# Patient Record
Sex: Female | Born: 1960 | Race: White | Hispanic: No | Marital: Married | Smoking: Former smoker
Health system: Southern US, Community
[De-identification: ages and names within clinical notes are randomized; demographics above are authoritative.]

## PROBLEM LIST (undated history)

## (undated) DIAGNOSIS — R7303 Prediabetes: Secondary | ICD-10-CM

## (undated) DIAGNOSIS — F32A Depression, unspecified: Secondary | ICD-10-CM

## (undated) DIAGNOSIS — F329 Major depressive disorder, single episode, unspecified: Secondary | ICD-10-CM

## (undated) DIAGNOSIS — Z87442 Personal history of urinary calculi: Secondary | ICD-10-CM

## (undated) DIAGNOSIS — F419 Anxiety disorder, unspecified: Secondary | ICD-10-CM

## (undated) DIAGNOSIS — Z889 Allergy status to unspecified drugs, medicaments and biological substances status: Secondary | ICD-10-CM

## (undated) DIAGNOSIS — K5900 Constipation, unspecified: Secondary | ICD-10-CM

## (undated) DIAGNOSIS — T7840XA Allergy, unspecified, initial encounter: Secondary | ICD-10-CM

## (undated) DIAGNOSIS — M1990 Unspecified osteoarthritis, unspecified site: Secondary | ICD-10-CM

## (undated) DIAGNOSIS — J449 Chronic obstructive pulmonary disease, unspecified: Secondary | ICD-10-CM

## (undated) DIAGNOSIS — E785 Hyperlipidemia, unspecified: Secondary | ICD-10-CM

## (undated) HISTORY — DX: Hyperlipidemia, unspecified: E78.5

## (undated) HISTORY — PX: VESICOVAGINAL FISTULA CLOSURE W/ TAH: SUR271

## (undated) HISTORY — DX: Major depressive disorder, single episode, unspecified: F32.9

## (undated) HISTORY — DX: Depression, unspecified: F32.A

## (undated) HISTORY — DX: Constipation, unspecified: K59.00

## (undated) HISTORY — DX: Allergy status to unspecified drugs, medicaments and biological substances: Z88.9

## (undated) HISTORY — PX: COLONOSCOPY: SHX174

## (undated) HISTORY — DX: Allergy, unspecified, initial encounter: T78.40XA

## (undated) HISTORY — DX: Anxiety disorder, unspecified: F41.9

## (undated) HISTORY — DX: Prediabetes: R73.03

## (undated) HISTORY — PX: KIDNEY SURGERY: SHX687

## (undated) HISTORY — PX: PARTIAL HYSTERECTOMY: SHX80

## (undated) HISTORY — DX: Chronic obstructive pulmonary disease, unspecified: J44.9

---

## 1999-06-05 ENCOUNTER — Encounter: Payer: Self-pay | Admitting: Urology

## 1999-06-05 ENCOUNTER — Emergency Department (HOSPITAL_COMMUNITY): Admission: EM | Admit: 1999-06-05 | Discharge: 1999-06-05 | Payer: Self-pay | Admitting: Emergency Medicine

## 1999-06-06 ENCOUNTER — Encounter: Payer: Self-pay | Admitting: Urology

## 1999-06-06 ENCOUNTER — Inpatient Hospital Stay (HOSPITAL_COMMUNITY): Admission: EM | Admit: 1999-06-06 | Discharge: 1999-06-08 | Payer: Self-pay | Admitting: Emergency Medicine

## 1999-06-07 ENCOUNTER — Encounter: Payer: Self-pay | Admitting: Urology

## 1999-06-24 ENCOUNTER — Encounter: Payer: Self-pay | Admitting: Urology

## 1999-06-24 ENCOUNTER — Ambulatory Visit (HOSPITAL_COMMUNITY): Admission: RE | Admit: 1999-06-24 | Discharge: 1999-06-24 | Payer: Self-pay | Admitting: Urology

## 2001-08-21 ENCOUNTER — Encounter: Payer: Self-pay | Admitting: General Practice

## 2001-08-21 ENCOUNTER — Ambulatory Visit (HOSPITAL_COMMUNITY): Admission: RE | Admit: 2001-08-21 | Discharge: 2001-08-21 | Payer: Self-pay | Admitting: General Practice

## 2009-04-16 ENCOUNTER — Emergency Department (HOSPITAL_COMMUNITY): Admission: EM | Admit: 2009-04-16 | Discharge: 2009-04-16 | Payer: Self-pay | Admitting: Emergency Medicine

## 2009-07-31 ENCOUNTER — Encounter: Admission: RE | Admit: 2009-07-31 | Discharge: 2009-07-31 | Payer: Self-pay | Admitting: Family Medicine

## 2009-12-09 ENCOUNTER — Encounter: Admission: RE | Admit: 2009-12-09 | Discharge: 2009-12-09 | Payer: Self-pay | Admitting: Family Medicine

## 2010-04-26 ENCOUNTER — Other Ambulatory Visit: Admission: RE | Admit: 2010-04-26 | Discharge: 2010-04-26 | Payer: Self-pay | Admitting: Radiology

## 2011-01-24 LAB — POCT I-STAT, CHEM 8
Calcium, Ion: 1.16 mmol/L (ref 1.12–1.32)
Chloride: 110 mEq/L (ref 96–112)
Glucose, Bld: 133 mg/dL — ABNORMAL HIGH (ref 70–99)
HCT: 36 % (ref 36.0–46.0)

## 2011-01-24 LAB — URINALYSIS, ROUTINE W REFLEX MICROSCOPIC
Ketones, ur: NEGATIVE mg/dL
Leukocytes, UA: NEGATIVE
Nitrite: NEGATIVE
Protein, ur: NEGATIVE mg/dL
Urobilinogen, UA: 0.2 mg/dL (ref 0.0–1.0)

## 2011-01-24 LAB — CBC
HCT: 36.2 % (ref 36.0–46.0)
Hemoglobin: 12.5 g/dL (ref 12.0–15.0)
MCHC: 34.4 g/dL (ref 30.0–36.0)
MCV: 94.8 fL (ref 78.0–100.0)
RBC: 3.82 MIL/uL — ABNORMAL LOW (ref 3.87–5.11)

## 2011-01-24 LAB — DIFFERENTIAL
Basophils Relative: 0 % (ref 0–1)
Eosinophils Absolute: 0 10*3/uL (ref 0.0–0.7)
Eosinophils Relative: 0 % (ref 0–5)
Monocytes Absolute: 0.3 10*3/uL (ref 0.1–1.0)
Monocytes Relative: 3 % (ref 3–12)
Neutro Abs: 9.7 10*3/uL — ABNORMAL HIGH (ref 1.7–7.7)

## 2011-05-01 ENCOUNTER — Emergency Department (HOSPITAL_COMMUNITY)
Admission: EM | Admit: 2011-05-01 | Discharge: 2011-05-01 | Disposition: A | Discharge status: Home / Self Care | Payer: 59 | Attending: Emergency Medicine | Admitting: Emergency Medicine

## 2011-05-01 ENCOUNTER — Emergency Department (HOSPITAL_COMMUNITY): Payer: 59

## 2011-05-01 DIAGNOSIS — Z87442 Personal history of urinary calculi: Secondary | ICD-10-CM | POA: Insufficient documentation

## 2011-05-01 DIAGNOSIS — F329 Major depressive disorder, single episode, unspecified: Secondary | ICD-10-CM | POA: Insufficient documentation

## 2011-05-01 DIAGNOSIS — R112 Nausea with vomiting, unspecified: Secondary | ICD-10-CM | POA: Insufficient documentation

## 2011-05-01 DIAGNOSIS — F3289 Other specified depressive episodes: Secondary | ICD-10-CM | POA: Insufficient documentation

## 2011-05-01 DIAGNOSIS — R109 Unspecified abdominal pain: Secondary | ICD-10-CM | POA: Insufficient documentation

## 2011-05-01 DIAGNOSIS — J984 Other disorders of lung: Secondary | ICD-10-CM | POA: Insufficient documentation

## 2011-05-01 DIAGNOSIS — N200 Calculus of kidney: Secondary | ICD-10-CM | POA: Insufficient documentation

## 2011-05-01 LAB — URINALYSIS, ROUTINE W REFLEX MICROSCOPIC
Bilirubin Urine: NEGATIVE
Ketones, ur: 40 mg/dL — AB
Nitrite: NEGATIVE
pH: 6 (ref 5.0–8.0)

## 2011-08-02 ENCOUNTER — Other Ambulatory Visit: Payer: Self-pay | Admitting: Family Medicine

## 2011-08-02 ENCOUNTER — Other Ambulatory Visit (HOSPITAL_COMMUNITY)
Admission: RE | Admit: 2011-08-02 | Discharge: 2011-08-02 | Disposition: A | Discharge status: Home / Self Care | Payer: 59 | Source: Ambulatory Visit | Attending: Family Medicine | Admitting: Family Medicine

## 2011-08-02 DIAGNOSIS — Z01419 Encounter for gynecological examination (general) (routine) without abnormal findings: Secondary | ICD-10-CM | POA: Insufficient documentation

## 2011-08-02 DIAGNOSIS — Z1159 Encounter for screening for other viral diseases: Secondary | ICD-10-CM | POA: Insufficient documentation

## 2011-09-01 ENCOUNTER — Encounter: Payer: Self-pay | Admitting: Emergency Medicine

## 2011-09-02 ENCOUNTER — Ambulatory Visit (INDEPENDENT_AMBULATORY_CARE_PROVIDER_SITE_OTHER): Payer: 59 | Admitting: Emergency Medicine

## 2011-09-02 ENCOUNTER — Encounter: Payer: Self-pay | Admitting: Emergency Medicine

## 2011-09-02 DIAGNOSIS — J449 Chronic obstructive pulmonary disease, unspecified: Secondary | ICD-10-CM | POA: Insufficient documentation

## 2011-09-02 DIAGNOSIS — R911 Solitary pulmonary nodule: Secondary | ICD-10-CM

## 2011-09-02 DIAGNOSIS — J984 Other disorders of lung: Secondary | ICD-10-CM

## 2011-09-02 DIAGNOSIS — Z720 Tobacco use: Secondary | ICD-10-CM

## 2011-09-02 DIAGNOSIS — F17200 Nicotine dependence, unspecified, uncomplicated: Secondary | ICD-10-CM

## 2011-09-02 NOTE — Assessment & Plan Note (Signed)
Full PFT 

## 2011-09-02 NOTE — Patient Instructions (Signed)
We will perform a CT scan of the chest We will perform full lung function testing at your next office visit Follow with Dr Delton Coombes next available appointment after the CT scan

## 2011-09-02 NOTE — Assessment & Plan Note (Signed)
Dedicated CT scan chest to screen for other nodules  rov after

## 2011-09-02 NOTE — Progress Notes (Signed)
  Subjective:    Patient ID: Susan Berger, female    DOB: 06/22/1961, 50 y.o.   MRN: [id number]  HPI 49 yo woman, former tobacco (33 pk-yrs), had CT scan of abdomen for renal stones in 7/12, found a 2.43mm RLL nodule. Referred for the nodule. She tells me that she has had cough, clear sputum. Also some exertional SOB.    Review of Systems  Constitutional: Negative for fever and unexpected weight change.  HENT: Positive for ear pain and congestion. Negative for nosebleeds, sore throat, rhinorrhea, sneezing, trouble swallowing, dental problem, postnasal drip and sinus pressure.   Eyes: Negative for redness and itching.  Respiratory: Positive for cough and shortness of breath. Negative for chest tightness and wheezing.   Cardiovascular: Negative for palpitations and leg swelling.  Gastrointestinal: Negative for nausea and vomiting.  Genitourinary: Negative for dysuria.  Musculoskeletal: Negative for joint swelling.  Skin: Negative for rash.  Neurological: Positive for headaches.  Hematological: Does not bruise/bleed easily.  Psychiatric/Behavioral: Positive for dysphoric mood. The patient is nervous/anxious.     Past Medical History  Diagnosis Date  . Borderline diabetes   . Hyperlipidemia   . Migraine   . Anxiety   . Depression   . Multiple allergies     allergy vac 3 days a week     Family History  Problem Relation Age of Onset  . Emphysema Mother   . COPD Mother   . Lung cancer Mother   . Asthma Paternal Grandmother   . Lung cancer Paternal Grandfather   . Diabetes       History   Social History  . Marital Status: Married    Spouse Name: N/A    Number of Children: N/A  . Years of Education: N/A   Occupational History  . Not on file.   Social History Main Topics  . Smoking status: Former Smoker -- 1.0 packs/day for 33 years    Types: Cigarettes    Quit date: 04/01/2011  . Smokeless tobacco: Never Used  . Alcohol Use: Yes     rare  . Drug Use: No  .  Sexually Active: Not on file   Other Topics Concern  . Not on file   Social History Narrative  . No narrative on file     No Known Allergies   No outpatient prescriptions prior to visit.        Objective:   Physical Exam  Gen: Pleasant, well-nourished, in no distress,  normal affect  ENT: No lesions,  mouth clear,  oropharynx clear, no postnasal drip  Neck: No JVD, no TMG, no carotid bruits  Lungs: No use of accessory muscles, no dullness to percussion, clear without rales or rhonchi  Cardiovascular: RRR, heart sounds normal, no murmur or gallops, no peripheral edema  Abdomen: soft and NT, no HSM,  BS normal  Musculoskeletal: No deformities, no cyanosis or clubbing  Neuro: alert, non focal  Skin: Warm, no lesions or rash      Assessment & Plan:  Pulmonary nodule, right Dedicated CT scan chest to screen for other nodules  rov after   Tobacco abuse Full PFT

## 2011-09-06 ENCOUNTER — Ambulatory Visit (INDEPENDENT_AMBULATORY_CARE_PROVIDER_SITE_OTHER)
Admission: RE | Admit: 2011-09-06 | Discharge: 2011-09-06 | Disposition: A | Discharge status: Home / Self Care | Payer: 59 | Source: Ambulatory Visit | Attending: Emergency Medicine | Admitting: Emergency Medicine

## 2011-09-06 DIAGNOSIS — J984 Other disorders of lung: Secondary | ICD-10-CM

## 2011-09-06 DIAGNOSIS — R911 Solitary pulmonary nodule: Secondary | ICD-10-CM

## 2011-09-06 MED ORDER — IOHEXOL 300 MG/ML  SOLN
80.0000 mL | Freq: Once | INTRAMUSCULAR | Status: AC | PRN
  Administered 2011-09-06: 80 mL via INTRAVENOUS

## 2011-10-03 ENCOUNTER — Other Ambulatory Visit: Payer: Self-pay | Admitting: Family Medicine

## 2011-10-03 ENCOUNTER — Encounter: Payer: Self-pay | Admitting: Emergency Medicine

## 2011-10-03 ENCOUNTER — Ambulatory Visit (INDEPENDENT_AMBULATORY_CARE_PROVIDER_SITE_OTHER): Payer: 59 | Admitting: Emergency Medicine

## 2011-10-03 ENCOUNTER — Ambulatory Visit
Admission: RE | Admit: 2011-10-03 | Discharge: 2011-10-03 | Disposition: A | Discharge status: Home / Self Care | Payer: 59 | Source: Ambulatory Visit | Attending: Family Medicine | Admitting: Family Medicine

## 2011-10-03 DIAGNOSIS — J4489 Other specified chronic obstructive pulmonary disease: Secondary | ICD-10-CM

## 2011-10-03 DIAGNOSIS — J984 Other disorders of lung: Secondary | ICD-10-CM

## 2011-10-03 DIAGNOSIS — R52 Pain, unspecified: Secondary | ICD-10-CM

## 2011-10-03 DIAGNOSIS — J449 Chronic obstructive pulmonary disease, unspecified: Secondary | ICD-10-CM

## 2011-10-03 DIAGNOSIS — R911 Solitary pulmonary nodule: Secondary | ICD-10-CM

## 2011-10-03 LAB — PULMONARY FUNCTION TEST

## 2011-10-03 MED ORDER — ALBUTEROL SULFATE HFA 108 (90 BASE) MCG/ACT IN AERS: 2.0000 | INHALATION_SPRAY | Freq: Four times a day (QID) | RESPIRATORY_TRACT | Status: DC | PRN

## 2011-10-03 NOTE — Patient Instructions (Signed)
Your CT scan shows a small right pulmonary nodule. We will repeat your CT scan in November 2013.  Your pulmonary function testing shows evidence for mild COPD.  We will teach you how to use Ventolin today. You should use 2 puffs if needed for shortness of breath. Keep available in event of emergencies Blood work today Follow with Dr Delton Coombes in 1 year after your CT scan

## 2011-10-03 NOTE — Progress Notes (Signed)
PFT done today. 

## 2011-10-03 NOTE — Assessment & Plan Note (Signed)
RUL apical 4mm nodule seen (the original nodule from abd CT was not seen) - repeat CT scan in 1 yr given size and her tobacco hx

## 2011-10-03 NOTE — Assessment & Plan Note (Signed)
Discussed PFT w her.  - teach her SABA to use prn - check a1AT - rov 1 yr

## 2011-10-03 NOTE — Progress Notes (Signed)
  Subjective:    Patient ID: Susan Berger, female    DOB: 1961-02-17, 50 y.o.   MRN: [id number]  HPI 50 yo woman, former tobacco (33 pk-yrs), had CT scan of abdomen for renal stones in 7/12, found a 2.34mm RLL nodule. Referred for the nodule. She tells me that she has had cough, clear sputum. Also some exertional SOB.   ROV 10/03/11 -- f/u visit, tobacco hx and nodule by CT scan 7/12. She is doing well, no breathing complaints. 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 CT scan done 11/20 = 4mm R apical nodule.      Objective:   Physical Exam  Gen: Pleasant, well-nourished, in no distress,  normal affect  ENT: No lesions,  mouth clear,  oropharynx clear, no postnasal drip  Neck: No JVD, no TMG, no carotid bruits  Lungs: No use of accessory muscles, no dullness to percussion, clear without rales or rhonchi  Cardiovascular: RRR, heart sounds normal, no murmur or gallops, no peripheral edema  Abdomen: soft and NT, no HSM,  BS normal  Musculoskeletal: No deformities, no cyanosis or clubbing  Neuro: alert, non focal  Skin: Warm, no lesions or rash   CT CHEST WITH CONTRAST 09/06/11:  Technique: Multidetector CT imaging of the chest was performed  following the standard protocol during bolus administration of  intravenous contrast.  Contrast: 80mL OMNIPAQUE IOHEXOL 300 MG/ML IV SOLN  Comparison: [facility] CT abdomen pelvis dated 05/01/2011  Findings: Suspected right upper lobe pulmonary nodule along the  minor fissure is no longer visualized and might have been  infectious/inflammatory. 4 mm nodule in the right lung apex  (seires 3/image 9), likely benign. Mild linear scarring at the  right lung base. Mild dependent atelectasis at the left lung base.  Moderate paraseptal and centrilobular emphysematous changes in the  upper lobes. No pleural effusion or pneumothorax.  Visualized thyroid is unremarkable.  The heart is normal in size. No pericardial effusion.  No suspicious  mediastinal, hilar, or axillary lymphadenopathy.  Visualized upper abdomen is unremarkable.  Mild degenerative changes of the thoracic spine.  IMPRESSION:  Suspected right upper lobe pulmonary nodule seen on prior CT  abdomen/pelvis is no longer visualized and may have been  infectious/inflammatory.  4 mm nodule in the right lung apex, likely benign. A single 12-  month follow-up CT chest is suggested per Fleischner Society  guidelines.  Moderate paraseptal and centrilobular emphysematous changes.      Assessment & Plan:  COPD (chronic obstructive pulmonary disease) Discussed PFT w her.  - teach her SABA to use prn - check a1AT - rov 1 yr  Pulmonary nodule, right RUL apical 4mm nodule seen (the original nodule from abd CT was not seen) - repeat CT scan in 1 yr given size and her tobacco hx

## 2011-11-07 ENCOUNTER — Telehealth: Payer: Self-pay | Admitting: Emergency Medicine

## 2011-11-07 NOTE — Telephone Encounter (Signed)
Faxed 10 pages to Dr. Tedra Senegal office.  Patient was there to be seen.  11/07/2011

## 2011-12-09 ENCOUNTER — Encounter: Payer: Self-pay | Admitting: Emergency Medicine

## 2012-02-06 ENCOUNTER — Other Ambulatory Visit: Payer: Self-pay | Admitting: Family Medicine

## 2012-02-06 DIAGNOSIS — R52 Pain, unspecified: Secondary | ICD-10-CM

## 2012-02-07 ENCOUNTER — Ambulatory Visit
Admission: RE | Admit: 2012-02-07 | Discharge: 2012-02-07 | Disposition: A | Discharge status: Home / Self Care | Payer: 59 | Source: Ambulatory Visit | Attending: Family Medicine | Admitting: Family Medicine

## 2012-02-07 DIAGNOSIS — R52 Pain, unspecified: Secondary | ICD-10-CM

## 2012-09-11 ENCOUNTER — Telehealth: Payer: Self-pay | Admitting: Emergency Medicine

## 2012-09-11 DIAGNOSIS — R911 Solitary pulmonary nodule: Secondary | ICD-10-CM

## 2012-09-11 DIAGNOSIS — J449 Chronic obstructive pulmonary disease, unspecified: Secondary | ICD-10-CM

## 2012-09-11 NOTE — Telephone Encounter (Signed)
Order has been placed. Please advise pcc's thanks

## 2012-09-11 NOTE — Telephone Encounter (Signed)
Need a new order placed. The order that is in there is from 10/03/11, which has expired and we are unable to schedule off of an expired order.

## 2012-09-11 NOTE — Telephone Encounter (Signed)
CT Chest appointment scheduled for Monday 09/24/12 at 9:30 at [facility]. Appointment card mailed to patient with appointment date, time and location. Pt is aware of appointment. Rhonda J Cobb

## 2012-09-24 ENCOUNTER — Ambulatory Visit (INDEPENDENT_AMBULATORY_CARE_PROVIDER_SITE_OTHER)
Admission: RE | Admit: 2012-09-24 | Discharge: 2012-09-24 | Disposition: A | Discharge status: Home / Self Care | Payer: 59 | Source: Ambulatory Visit | Attending: Emergency Medicine | Admitting: Emergency Medicine

## 2012-09-24 DIAGNOSIS — R911 Solitary pulmonary nodule: Secondary | ICD-10-CM

## 2012-09-24 DIAGNOSIS — J449 Chronic obstructive pulmonary disease, unspecified: Secondary | ICD-10-CM

## 2012-10-23 ENCOUNTER — Ambulatory Visit (INDEPENDENT_AMBULATORY_CARE_PROVIDER_SITE_OTHER): Payer: 59 | Admitting: Emergency Medicine

## 2012-10-23 ENCOUNTER — Encounter: Payer: Self-pay | Admitting: Emergency Medicine

## 2012-10-23 VITALS — BP 118/80 | HR 96 | Temp 97.2°F | Ht 66.0 in | Wt 178.4 lb

## 2012-10-23 DIAGNOSIS — R911 Solitary pulmonary nodule: Secondary | ICD-10-CM

## 2012-10-23 DIAGNOSIS — J449 Chronic obstructive pulmonary disease, unspecified: Secondary | ICD-10-CM

## 2012-10-23 NOTE — Patient Instructions (Addendum)
We will repeat your CT scan of the chest in December 2014 and then follow up to review after it is completed.

## 2012-10-23 NOTE — Addendum Note (Signed)
Addended by: Orma Flaming D on: 10/23/2012 04:36 PM   Modules accepted: Orders

## 2012-10-23 NOTE — Progress Notes (Signed)
  Subjective:    Patient ID: Susan Berger, female    DOB: 05-Jul-1961, 52 y.o.   MRN: [id number]  HPI 52 yo woman, former tobacco (33 pk-yrs), had CT scan of abdomen for renal stones in 7/12, found a 2.58mm RLL nodule. Referred for the nodule. She tells me that she has had cough, clear sputum. Also some exertional SOB.   ROV 10/03/11 -- f/u visit, tobacco hx and nodule by CT scan 7/12. She is doing well, no breathing complaints. 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 CT scan done 11/20 = 4mm R apical nodule.   ROV 10/23/12 -- here to f/u tobacco use, mild AFL, R apical nodule on CT scan 08/2011. Repeat scan 09/24/12 >> no change in the R apical nodule. She uses albuterol sometimes to pretreat exercise.  No exacerbations     Objective:   Physical Exam Filed Vitals:   10/23/12 1341  BP: 118/80  Pulse: 96  Temp: 97.2 F (36.2 C)    Gen: Pleasant, well-nourished, in no distress,  normal affect  ENT: No lesions,  mouth clear,  oropharynx clear, no postnasal drip  Neck: No JVD, no TMG, no carotid bruits  Lungs: No use of accessory muscles, no dullness to percussion, clear without rales or rhonchi  Cardiovascular: RRR, heart sounds normal, no murmur or gallops, no peripheral edema  Abdomen: soft and NT, no HSM,  BS normal  Musculoskeletal: No deformities, no cyanosis or clubbing  Neuro: alert, non focal  Skin: Warm, no lesions or rash   CT chest 09/24/12 --  Comparison: 09/06/2011  Findings: Lungs/pleura: Mild changes of centrilobular emphysema.  No pleural effusion identified. Stable scar in the right lateral  lung base. The 4 mm nodule in the right apex is stable, image 9.  No new pulmonary nodules identified.  Heart/Mediastinum: No enlarged mediastinal or hilar lymph nodes.  Heart size is normal. No pericardial effusion.  Upper abdomen: No abnormal findings identified.  Bones/Musculoskeletal: No abnormalities identified.  IMPRESSION:  1. Stable 4 mm nodule in the  right lung apex. This is most likely  benign.  2. Emphysema.      Assessment & Plan:  COPD (chronic obstructive pulmonary disease) continue SABA prn.  No indication for scheduled therapy at this time  Pulmonary nodule, right Stable by CT scan.  Repeat scan in 1 year, low dose May be a candidate for continued surveillance with LDCT, will discuss next time.

## 2012-10-23 NOTE — Assessment & Plan Note (Signed)
Stable by CT scan.  Repeat scan in 1 year, low dose May be a candidate for continued surveillance with LDCT, will discuss next time.

## 2012-10-23 NOTE — Assessment & Plan Note (Signed)
continue SABA prn.  No indication for scheduled therapy at this time

## 2013-01-29 ENCOUNTER — Other Ambulatory Visit: Payer: Self-pay | Admitting: Family Medicine

## 2013-01-29 ENCOUNTER — Other Ambulatory Visit (HOSPITAL_COMMUNITY)
Admission: RE | Admit: 2013-01-29 | Discharge: 2013-01-29 | Disposition: A | Discharge status: Home / Self Care | Payer: 59 | Source: Ambulatory Visit | Attending: Family Medicine | Admitting: Family Medicine

## 2013-01-29 DIAGNOSIS — Z0000 Encounter for general adult medical examination without abnormal findings: Secondary | ICD-10-CM | POA: Insufficient documentation

## 2013-02-15 ENCOUNTER — Other Ambulatory Visit: Payer: Self-pay | Admitting: Dermatology

## 2013-09-24 ENCOUNTER — Ambulatory Visit (INDEPENDENT_AMBULATORY_CARE_PROVIDER_SITE_OTHER)
Admission: RE | Admit: 2013-09-24 | Discharge: 2013-09-24 | Disposition: A | Discharge status: Home / Self Care | Payer: 59 | Source: Ambulatory Visit | Attending: Emergency Medicine | Admitting: Emergency Medicine

## 2013-09-24 DIAGNOSIS — R911 Solitary pulmonary nodule: Secondary | ICD-10-CM

## 2013-10-24 ENCOUNTER — Encounter: Payer: Self-pay | Admitting: Emergency Medicine

## 2013-10-24 ENCOUNTER — Ambulatory Visit (INDEPENDENT_AMBULATORY_CARE_PROVIDER_SITE_OTHER): Payer: 59 | Admitting: Emergency Medicine

## 2013-10-24 ENCOUNTER — Encounter (INDEPENDENT_AMBULATORY_CARE_PROVIDER_SITE_OTHER): Payer: Self-pay

## 2013-10-24 VITALS — BP 140/76 | HR 77 | Ht 65.0 in | Wt 177.4 lb

## 2013-10-24 DIAGNOSIS — R911 Solitary pulmonary nodule: Secondary | ICD-10-CM

## 2013-10-24 DIAGNOSIS — J449 Chronic obstructive pulmonary disease, unspecified: Secondary | ICD-10-CM

## 2013-10-24 NOTE — Progress Notes (Signed)
  Subjective:    Patient ID: Susan Berger, female    DOB: 02-Mar-1961, 53 y.o.   MRN: [id number]  HPI 53 yo woman, former tobacco (33 pk-yrs), had CT scan of abdomen for renal stones in 7/12, found a 2.79mm RLL nodule. Referred for the nodule. She tells me that she has had cough, clear sputum. Also some exertional SOB.   ROV 10/03/11 -- f/u visit, tobacco hx and nodule by CT scan 7/12. She is doing well, no breathing complaints. 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 CT scan done 11/20 = 54mm R apical nodule.   ROV 10/23/12 -- here to f/u tobacco use, mild AFL, R apical nodule on CT scan 08/2011. Repeat scan 09/24/12 >> no change in the R apical nodule. She uses albuterol sometimes to pretreat exercise.  No exacerbations  ROV 10/23/13 -- follows up for mild AFL, R apical nodule seen on CT scan 08/2011. Repeat CT scan 09/2013 > nodule is stable for 2 yrs. She has stable exertional sob. Uses albuterol 2-3x a week.      Objective:   Physical Exam Filed Vitals:   10/24/13 1557  BP: 140/76  Pulse: 77  Height: 5\' 5"  (1.651 m)  Weight: 177 lb 6.4 oz (80.468 kg)  SpO2: 96%   Gen: Pleasant, well-nourished, in no distress,  normal affect  ENT: No lesions,  mouth clear,  oropharynx clear, no postnasal drip  Neck: No JVD, no TMG, no carotid bruits  Lungs: No use of accessory muscles, no dullness to percussion, clear without rales or rhonchi  Cardiovascular: RRR, heart sounds normal, no murmur or gallops, no peripheral edema  Abdomen: soft and NT, no HSM,  BS normal  Musculoskeletal: No deformities, no cyanosis or clubbing  Neuro: alert, non focal  Skin: Warm, no lesions or rash   CT chest 09/24/12 --  Comparison: 09/06/2011  Findings: Lungs/pleura: Mild changes of centrilobular emphysema.  No pleural effusion identified. Stable scar in the right lateral  lung base. The 4 mm nodule in the right apex is stable, image 9.  No new pulmonary nodules identified.  Heart/Mediastinum: No  enlarged mediastinal or hilar lymph nodes.  Heart size is normal. No pericardial effusion.  Upper abdomen: No abnormal findings identified.  Bones/Musculoskeletal: No abnormalities identified.  IMPRESSION:  1. Stable 4 mm nodule in the right lung apex. This is most likely  benign.  2. Emphysema.      Assessment & Plan:  COPD (chronic obstructive pulmonary disease) We will try using Spiriva once a day to see if this beneficial.  Please call our office in a month to let us know if you benefit - if so then we will send to your pharmacy Use albuterol as needed  Follow with Dr Lamonte Sakai in 6 months or sooner if you have any problems  Pulmonary nodule, right Stable over 2 years.  She would qualify for LDCT screening in a year. Will discuss w her in the future to see whether insurance companies are covering, whether she wants to get

## 2013-10-24 NOTE — Patient Instructions (Signed)
We will try using Spiriva once a day to see if this beneficial.  Please call our office in a month to let us know if you benefit - if so then we will send to your pharmacy Use albuterol as needed  Follow with Dr Elaina Cara in 6 months or sooner if you have any problems 

## 2013-10-24 NOTE — Assessment & Plan Note (Signed)
We will try using Spiriva once a day to see if this beneficial.  Please call our office in a month to let us know if you benefit - if so then we will send to your pharmacy Use albuterol as needed  Follow with Dr Lamonte Sakai in 6 months or sooner if you have any problems

## 2013-10-24 NOTE — Assessment & Plan Note (Signed)
Stable over 2 years.  She would qualify for LDCT screening in a year. Will discuss w her in the future to see whether insurance companies are covering, whether she wants to get

## 2014-01-21 ENCOUNTER — Other Ambulatory Visit: Payer: Self-pay | Admitting: Emergency Medicine

## 2014-05-17 ENCOUNTER — Other Ambulatory Visit: Payer: Self-pay | Admitting: Emergency Medicine

## 2014-05-20 ENCOUNTER — Ambulatory Visit (INDEPENDENT_AMBULATORY_CARE_PROVIDER_SITE_OTHER): Payer: 59 | Admitting: Emergency Medicine

## 2014-05-20 ENCOUNTER — Encounter: Payer: Self-pay | Admitting: Emergency Medicine

## 2014-05-20 VITALS — BP 144/86 | HR 94 | Temp 96.8°F | Ht 66.0 in | Wt 162.0 lb

## 2014-05-20 DIAGNOSIS — Z87891 Personal history of nicotine dependence: Secondary | ICD-10-CM

## 2014-05-20 DIAGNOSIS — F17200 Nicotine dependence, unspecified, uncomplicated: Secondary | ICD-10-CM

## 2014-05-20 NOTE — Progress Notes (Signed)
  Subjective:    Patient ID: Susan Berger, female    DOB: 14-Nov-1960, 53 y.o.   MRN: [id number]  HPI 53 yo woman, former tobacco (33 pk-yrs), had CT scan of abdomen for renal stones in 7/12, found a 2.40mm RLL nodule. Referred for the nodule. She tells me that she has had cough, clear sputum. Also some exertional SOB.   ROV 10/03/11 -- f/u visit, tobacco hx and nodule by CT scan 7/12. She is doing well, no breathing complaints. 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 CT scan done 11/20 = 3mm R apical nodule.   ROV 10/23/12 -- here to f/u tobacco use, mild AFL, R apical nodule on CT scan 08/2011. Repeat scan 09/24/12 >> no change in the R apical nodule. She uses albuterol sometimes to pretreat exercise.  No exacerbations  ROV 10/23/13 -- follows up for mild AFL, R apical nodule seen on CT scan 08/2011. Repeat CT scan 09/2013 > nodule is stable for 2 yrs. She has stable exertional sob. Uses albuterol 2-3x a week.   ROV 05/20/14 --  follows up for mild AFL, R apical nodule seen on CT scan 08/2011 that was stable for 2+ years.  She tried spiriva but it didn't help her so she stopped. She is using albuterol prn, not every day. It helps with clearing secretions. She would qualify for LDCT screening.       Objective:   Physical Exam Filed Vitals:   05/20/14 1432 05/20/14 1433  BP: 144/86 144/86  Pulse: 94 94  Temp:  96.8 F (36 C)  TempSrc:  Oral  Height:  5\' 6"  (1.676 m)  Weight:  162 lb (73.483 kg)  SpO2:  95%   Gen: Pleasant, well-nourished, in no distress,  normal affect  ENT: No lesions,  mouth clear,  oropharynx clear, no postnasal drip  Neck: No JVD, no TMG, no carotid bruits  Lungs: No use of accessory muscles, no dullness to percussion, clear without rales or rhonchi  Cardiovascular: RRR, heart sounds normal, no murmur or gallops, no peripheral edema  Abdomen: soft and NT, no HSM,  BS normal  Musculoskeletal: No deformities, no cyanosis or clubbing  Neuro: alert, non  focal  Skin: Warm, no lesions or rash   CT chest 09/24/12 --  Comparison: 09/06/2011  Findings: Lungs/pleura: Mild changes of centrilobular emphysema.  No pleural effusion identified. Stable scar in the right lateral  lung base. The 4 mm nodule in the right apex is stable, image 9.  No new pulmonary nodules identified.  Heart/Mediastinum: No enlarged mediastinal or hilar lymph nodes.  Heart size is normal. No pericardial effusion.  Upper abdomen: No abnormal findings identified.  Bones/Musculoskeletal: No abnormalities identified.  IMPRESSION:  1. Stable 4 mm nodule in the right lung apex. This is most likely  benign.  2. Emphysema.      Assessment & Plan:  COPD (chronic obstructive pulmonary disease) - stop spiriva - continue albuterol prn - she qualifies for LDCT scan screening, wants to participate

## 2014-05-20 NOTE — Patient Instructions (Signed)
Please continue albuterol 2 puffs if needed We will perform CT scan screening to evaluate for early lung cancer Follow with Dr Lamonte Sakai in 12 months or sooner if you have any problems

## 2014-05-20 NOTE — Assessment & Plan Note (Signed)
-   stop spiriva - continue albuterol prn - she qualifies for LDCT scan screening, wants to participate

## 2014-06-30 ENCOUNTER — Ambulatory Visit (INDEPENDENT_AMBULATORY_CARE_PROVIDER_SITE_OTHER)
Admission: RE | Admit: 2014-06-30 | Discharge: 2014-06-30 | Disposition: A | Discharge status: Home / Self Care | Payer: Self-pay | Source: Ambulatory Visit | Attending: Emergency Medicine | Admitting: Emergency Medicine

## 2014-06-30 DIAGNOSIS — F17200 Nicotine dependence, unspecified, uncomplicated: Secondary | ICD-10-CM

## 2014-08-12 ENCOUNTER — Other Ambulatory Visit: Payer: Self-pay | Admitting: Dermatology

## 2014-09-07 ENCOUNTER — Other Ambulatory Visit: Payer: Self-pay | Admitting: Emergency Medicine

## 2015-03-30 ENCOUNTER — Other Ambulatory Visit: Payer: Self-pay | Admitting: Emergency Medicine

## 2015-04-27 ENCOUNTER — Other Ambulatory Visit: Payer: Self-pay | Admitting: Emergency Medicine

## 2015-07-07 ENCOUNTER — Encounter: Payer: Self-pay | Admitting: Emergency Medicine

## 2015-07-07 ENCOUNTER — Ambulatory Visit (INDEPENDENT_AMBULATORY_CARE_PROVIDER_SITE_OTHER): Payer: 59 | Admitting: Emergency Medicine

## 2015-07-07 ENCOUNTER — Encounter (INDEPENDENT_AMBULATORY_CARE_PROVIDER_SITE_OTHER): Payer: Self-pay

## 2015-07-07 VITALS — BP 126/76 | HR 77 | Ht 65.0 in | Wt 159.0 lb

## 2015-07-07 DIAGNOSIS — J449 Chronic obstructive pulmonary disease, unspecified: Secondary | ICD-10-CM

## 2015-07-07 DIAGNOSIS — Z87891 Personal history of nicotine dependence: Secondary | ICD-10-CM | POA: Diagnosis not present

## 2015-07-07 DIAGNOSIS — Z23 Encounter for immunization: Secondary | ICD-10-CM

## 2015-07-07 MED ORDER — TIOTROPIUM BROMIDE MONOHYDRATE 1.25 MCG/ACT IN AERS: 2.0000 | INHALATION_SPRAY | Freq: Every day | RESPIRATORY_TRACT | Status: DC

## 2015-07-07 NOTE — Assessment & Plan Note (Signed)
We will perform a trial of restarting Spiriva once a day Call our office in about 3-4 weeks to report on whether you have benefited from the spiriva Follow with Dr Lamonte Sakai in 6 months or sooner if you have any problems

## 2015-07-07 NOTE — Patient Instructions (Addendum)
We will perform a trial of restarting Spiriva once a day Call our office in about 3-4 weeks to report on whether you have benefited from the spiriva We will continue your Lung Cancer Screening CT scans, due this month Follow with Dr Lamonte Sakai in 6 months or sooner if you have any problems

## 2015-07-07 NOTE — Assessment & Plan Note (Signed)
We will continue your Lung Cancer Screening CT scans, due this month

## 2015-07-07 NOTE — Progress Notes (Signed)
Subjective:    Patient ID: Susan Berger, female    DOB: 03-21-1961, 54 y.o.   MRN: [id number]  HPI 54 yo woman, former tobacco (33 pk-yrs), had CT scan of abdomen for renal stones in 7/12, found a 2.12mm RLL nodule. Referred for the nodule. She tells me that she has had cough, clear sputum. Also some exertional SOB.   ROV 52/17/12 -- f/u visit, tobacco hx and nodule by CT scan 7/12. She is doing well, no breathing complaints. 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 CT scan done 11/20 = 52mm R apical nodule.   ROV 10/23/12 -- here to f/u tobacco use, mild AFL, R apical nodule on CT scan 08/2011. Repeat scan 09/24/12 >> no change in the R apical nodule. She uses albuterol sometimes to pretreat exercise.  No exacerbations  ROV 10/23/13 -- follows up for mild AFL, R apical nodule seen on CT scan 08/2011. Repeat CT scan 09/2013 > nodule is stable for 2 yrs. She has stable exertional sob. Uses albuterol 2-3x a week.   ROV 05/20/14 --  follows up for mild AFL, R apical nodule seen on CT scan 08/2011 that was stable for 2+ years.  She tried spiriva but it didn't help her so she stopped. She is using albuterol prn, not every day. It helps with clearing secretions. She would qualify for LDCT screening.    ROV 07/07/15 -- Follow-up visit for mild obstructive lung disease, stable right apical nodule originally identified in 2012. At our last visit we stopped Spiriva, continued albuterol as needed.  Not smoking. Able to be active. Breathing does not limit her unless she walks a long distance. She does have cough, every day especially in the am prod clear. She has a lot of nasal congestion and sinus fullness.   She is undergone low-dose CT scan screening in September 2015 with a RADS-2 scan.      Objective:   Physical Exam Filed Vitals:   07/07/15 1202 07/07/15 1203  BP:  126/76  Pulse:  77  Height: 5\' 5"  (1.651 m)   Weight: 159 lb (72.122 kg)   SpO2:  97%   Gen: Pleasant, well-nourished, in no  distress,  normal affect  ENT: No lesions,  mouth clear,  oropharynx clear, no postnasal drip  Neck: No JVD, no TMG, no carotid bruits  Lungs: No use of accessory muscles, no dullness to percussion, clear without rales or rhonchi  Cardiovascular: RRR, heart sounds normal, no murmur or gallops, no peripheral edema  Musculoskeletal: No deformities, no cyanosis or clubbing  Neuro: alert, non focal  Skin: Warm, no lesions or rash   CT chest 09/24/12 --  Comparison: 09/06/2011  Findings: Lungs/pleura: Mild changes of centrilobular emphysema.  No pleural effusion identified. Stable scar in the right lateral  lung base. The 4 mm nodule in the right apex is stable, image 9.  No new pulmonary nodules identified.  Heart/Mediastinum: No enlarged mediastinal or hilar lymph nodes.  Heart size is normal. No pericardial effusion.  Upper abdomen: No abnormal findings identified.  Bones/Musculoskeletal: No abnormalities identified.  IMPRESSION:  1. Stable 4 mm nodule in the right lung apex. This is most likely  benign.  2. Emphysema.      Assessment & Plan:  COPD (chronic obstructive pulmonary disease) We will perform a trial of restarting Spiriva once a day Call our office in about 3-4 weeks to report on whether you have benefited from the spiriva Follow with Dr Lamonte Sakai in 6  months or sooner if you have any problems  Hx of tobacco use, presenting hazards to health We will continue your Lung Cancer Screening CT scans, due this month

## 2015-08-07 ENCOUNTER — Telehealth: Payer: Self-pay | Admitting: Emergency Medicine

## 2015-08-07 DIAGNOSIS — R911 Solitary pulmonary nodule: Secondary | ICD-10-CM

## 2015-08-07 NOTE — Telephone Encounter (Signed)
Spoke with Eric Form, NP, and was advised pt does not meet the age criteria for the screen program. Judson Roch already spoke to pt regarding matter and pt is aware that the scan will need to be paid out of pocket, pt aware and consenting to move forward with scan. Order placed for low dose CT chest. Nothing further needed at this time.

## 2015-08-07 NOTE — Telephone Encounter (Signed)
Called and spoke to pt. Pt states she has not yet had her CT scan for lung cancer screening. Pt's last OV with RB was in 9.20.16.   Patient Instructions     We will perform a trial of restarting Spiriva once a day Call our office in about 3-4 weeks to report on whether you have benefited from the spiriva We will continue your Lung Cancer Screening CT scans, due this month Follow with Dr Lamonte Sakai in 6 months or sooner if you have any problems   Judson Roch please advise if pt has upcoming CT. Thanks.

## 2015-08-13 ENCOUNTER — Ambulatory Visit (INDEPENDENT_AMBULATORY_CARE_PROVIDER_SITE_OTHER)
Admission: RE | Admit: 2015-08-13 | Discharge: 2015-08-13 | Disposition: A | Discharge status: Home / Self Care | Payer: Self-pay | Source: Ambulatory Visit | Attending: Emergency Medicine | Admitting: Emergency Medicine

## 2015-08-13 DIAGNOSIS — R911 Solitary pulmonary nodule: Secondary | ICD-10-CM

## 2015-10-23 ENCOUNTER — Other Ambulatory Visit: Payer: Self-pay | Admitting: *Deleted

## 2015-10-23 MED ORDER — TIOTROPIUM BROMIDE MONOHYDRATE 1.25 MCG/ACT IN AERS: 2.0000 | INHALATION_SPRAY | Freq: Every day | RESPIRATORY_TRACT | Status: DC

## 2015-12-14 ENCOUNTER — Ambulatory Visit (AMBULATORY_SURGERY_CENTER): Payer: Self-pay | Admitting: *Deleted

## 2015-12-14 VITALS — Ht 66.0 in | Wt 165.0 lb

## 2015-12-14 DIAGNOSIS — Z1211 Encounter for screening for malignant neoplasm of colon: Secondary | ICD-10-CM

## 2015-12-14 MED ORDER — NA SULFATE-K SULFATE-MG SULF 17.5-3.13-1.6 GM/177ML PO SOLN: 1.0000 | Freq: Once | ORAL | Status: DC

## 2015-12-14 NOTE — Progress Notes (Signed)
No egg or soy allergy known to patient  No issues with past sedation with any surgeries  or procedures, no intubation problems  No diet pills per patient No home 02 use per patient  No blood thinners per patient    

## 2015-12-21 ENCOUNTER — Encounter: Payer: Self-pay | Admitting: Internal Medicine

## 2015-12-21 ENCOUNTER — Ambulatory Visit (AMBULATORY_SURGERY_CENTER): Payer: 59 | Admitting: Internal Medicine

## 2015-12-21 VITALS — BP 119/68 | HR 63 | Temp 96.8°F | Resp 18 | Ht 66.0 in | Wt 165.0 lb

## 2015-12-21 DIAGNOSIS — D127 Benign neoplasm of rectosigmoid junction: Secondary | ICD-10-CM | POA: Diagnosis not present

## 2015-12-21 DIAGNOSIS — K635 Polyp of colon: Secondary | ICD-10-CM

## 2015-12-21 DIAGNOSIS — K621 Rectal polyp: Secondary | ICD-10-CM

## 2015-12-21 DIAGNOSIS — Z1211 Encounter for screening for malignant neoplasm of colon: Secondary | ICD-10-CM

## 2015-12-21 DIAGNOSIS — D129 Benign neoplasm of anus and anal canal: Secondary | ICD-10-CM

## 2015-12-21 DIAGNOSIS — D128 Benign neoplasm of rectum: Secondary | ICD-10-CM

## 2015-12-21 MED ORDER — SODIUM CHLORIDE 0.9 % IV SOLN: 500.0000 mL | INTRAVENOUS | Status: DC

## 2015-12-21 NOTE — Patient Instructions (Signed)
YOU HAD AN ENDOSCOPIC PROCEDURE TODAY AT THE [facility]:   Refer to the procedure report that was given to you for any specific questions about what was found during the examination.  If the procedure report does not answer your questions, please call your gastroenterologist to clarify.  If you requested that your care partner not be given the details of your procedure findings, then the procedure report has been included in a sealed envelope for you to review at your convenience later.  YOU SHOULD EXPECT: Some feelings of bloating in the abdomen. Passage of more gas than usual.  Walking can help get rid of the air that was put into your GI tract during the procedure and reduce the bloating. If you had a lower endoscopy (such as a colonoscopy or flexible sigmoidoscopy) you may notice spotting of blood in your stool or on the toilet paper. If you underwent a bowel prep for your procedure, you may not have a normal bowel movement for a few days.  Please Note:  You might notice some irritation and congestion in your nose or some drainage.  This is from the oxygen used during your procedure.  There is no need for concern and it should clear up in a day or so.  SYMPTOMS TO REPORT IMMEDIATELY:   Following lower endoscopy (colonoscopy or flexible sigmoidoscopy):  Excessive amounts of blood in the stool  Significant tenderness or worsening of abdominal pains  Swelling of the abdomen that is new, acute  Fever of 100F or higher  For urgent or emergent issues, a gastroenterologist can be reached at any hour by calling [phone].  DIET: Your first meal following the procedure should be a small meal and then it is ok to progress to your normal diet. Heavy or fried foods are harder to digest and may make you feel nauseous or bloated.  Likewise, meals heavy in dairy and vegetables can increase bloating.  Drink plenty of fluids but you should avoid alcoholic beverages for 24 hours.  ACTIVITY:   You should plan to take it easy for the rest of today and you should NOT DRIVE or use heavy machinery until tomorrow (because of the sedation medicines used during the test).    FOLLOW UP: Our staff will call the number listed on your records the next business day following your procedure to check on you and address any questions or concerns that you may have regarding the information given to you following your procedure. If we do not reach you, we will leave a message.  However, if you are feeling well and you are not experiencing any problems, there is no need to return our call.  We will assume that you have returned to your regular daily activities without incident.  If any biopsies were taken you will be contacted by phone or by letter within the next 1-3 weeks.  Please call us at [phone] if you have not heard about the biopsies in 3 weeks.    SIGNATURES/CONFIDENTIALITY: You and/or your care partner have signed paperwork which will be entered into your electronic medical record.  These signatures attest to the fact that that the information above on your After Visit Summary has been reviewed and is understood.  Full responsibility of the confidentiality of this discharge information lies with you and/or your care-partner.  Continue your normal medications  Please read over handout about polyps 

## 2015-12-21 NOTE — Op Note (Signed)
[place]  [address] [place], [id number]   COLONOSCOPY PROCEDURE REPORT  PATIENT: Susan Berger, Susan Berger  MR#: [id number] BIRTHDATE: 11/07/1960 , 54  yrs. old GENDER: female ENDOSCOPIST: Jerene Bears, MD REFERRED [id number] Toy Cookey, NP PROCEDURE DATE:  12/21/2015 PROCEDURE:   Colonoscopy, screening and Colonoscopy with snare polypectomy First Screening Colonoscopy - Avg.  risk and is 50 yrs.  old or older Yes.  Prior Negative Screening - Now for repeat screening. N/A  History of Adenoma - Now for follow-up colonoscopy & has been > or = to 3 yrs.  N/A  Polyps removed today? Yes ASA CLASS:   Class III INDICATIONS:Screening for colonic neoplasia, Colorectal Neoplasm Risk Assessment for this procedure is average risk, and 1st colonoscopy. MEDICATIONS: Monitored anesthesia care and Propofol 280 mg IV  DESCRIPTION OF PROCEDURE:   After the risks benefits and alternatives of the procedure were thoroughly explained, informed consent was obtained.  The digital rectal exam revealed no rectal mass.   The LB [id number]  endoscope was introduced through the anus and advanced to the cecum, which was identified by both the appendix and ileocecal valve. No adverse events experienced. The quality of the prep was good.  (Suprep was used)  The instrument was then slowly withdrawn as the colon was fully examined. Estimated blood loss is zero unless otherwise noted in this procedure report.   COLON FINDINGS: Two sessile polyps ranging between 3-47mm in size were found in the rectosigmoid colon and rectum.  Polypectomies were performed with a cold snare.  The resection was complete, the polyp tissue was completely retrieved and sent to histology.   The examination was otherwise normal.  Retroflexed views revealed no abnormalities. The time to cecum = 3.0 Withdrawal time = 8.7   The scope was withdrawn and the procedure completed. COMPLICATIONS: There were no immediate  complications.  ENDOSCOPIC IMPRESSION: 1.   Two sessile polyps ranging between 3-25mm in size were found in the rectosigmoid colon and rectum; polypectomies were performed with a cold snare 2.   The examination was otherwise normal  RECOMMENDATIONS: 1.  Await pathology results 2.  If the polyps removed today are proven to be adenomatous (pre-cancerous) polyps, you will need a repeat colonoscopy in 5 years.  Otherwise you should continue to follow colorectal cancer screening guidelines for "routine risk" patients with colonoscopy in 10 years.  You will receive a letter within 1-2 weeks with the results of your biopsy as well as final recommendations.  Please call my office if you have not received a letter after 3 weeks.  eSigned:  Jerene Bears, MD 12/21/2015 9:59 AM  cc:  the patient, Delia Chimes, NP

## 2015-12-21 NOTE — Progress Notes (Signed)
Called to room to assist during endoscopic procedure.  Patient ID and intended procedure confirmed with present staff. Received instructions for my participation in the procedure from the performing physician.  

## 2015-12-21 NOTE — Progress Notes (Signed)
To pacu vss patent aw report to rn 

## 2015-12-22 ENCOUNTER — Telehealth: Payer: Self-pay | Admitting: *Deleted

## 2015-12-22 NOTE — Telephone Encounter (Signed)
  Follow up Call-  Call back number 12/21/2015  Post procedure Call Back phone  # [phone]  Permission to leave phone message Yes      Patient questions:  Do you have a fever, pain , or abdominal swelling? No. Pain Score  0 *  Have you tolerated food without any problems? Yes.    Have you been able to return to your normal activities? Yes.    Do you have any questions about your discharge instructions: Diet   No. Medications  No. Follow up visit  No.  Do you have questions or concerns about your Care? No.  Actions: * If pain score is 4 or above: No action needed, pain <4.

## 2015-12-24 ENCOUNTER — Encounter: Payer: Self-pay | Admitting: Internal Medicine

## 2016-01-11 ENCOUNTER — Ambulatory Visit: Payer: 59 | Admitting: Emergency Medicine

## 2016-03-03 ENCOUNTER — Ambulatory Visit (INDEPENDENT_AMBULATORY_CARE_PROVIDER_SITE_OTHER): Payer: 59 | Admitting: Emergency Medicine

## 2016-03-03 ENCOUNTER — Encounter: Payer: Self-pay | Admitting: Emergency Medicine

## 2016-03-03 VITALS — BP 120/84 | HR 66 | Ht 66.0 in | Wt 165.0 lb

## 2016-03-03 DIAGNOSIS — J449 Chronic obstructive pulmonary disease, unspecified: Secondary | ICD-10-CM | POA: Diagnosis not present

## 2016-03-03 DIAGNOSIS — R911 Solitary pulmonary nodule: Secondary | ICD-10-CM | POA: Diagnosis not present

## 2016-03-03 DIAGNOSIS — Z87891 Personal history of nicotine dependence: Secondary | ICD-10-CM

## 2016-03-03 DIAGNOSIS — J309 Allergic rhinitis, unspecified: Secondary | ICD-10-CM

## 2016-03-03 MED ORDER — FLUTICASONE PROPIONATE 50 MCG/ACT NA SUSP: 2.0000 | Freq: Every day | NASAL | Status: DC

## 2016-03-03 NOTE — Assessment & Plan Note (Signed)
Low-dose CT scan screening, currently a RADS-2. Due for repeat scanning fall 2017

## 2016-03-03 NOTE — Assessment & Plan Note (Signed)
Please continue Spiriva every day as you are taking it Please continue albuterol 2 puffs every 4 hours if needed for shortness of breath. We will repeat your pulmonary function testing at your next office visit in November Follow with Dr Lamonte Sakai in November 2018 with full PFT on the same day.

## 2016-03-03 NOTE — Assessment & Plan Note (Signed)
Stable by serial CTs.

## 2016-03-03 NOTE — Patient Instructions (Addendum)
Please continue Spiriva every day as you are taking it Please continue albuterol 2 puffs every 4 hours if needed for shortness of breath. Continue allergy pill as you are taking it Please start fluticasone nasal spray, 2 sprays each nostril once a day We will repeat your pulmonary function testing at your next office visit in November We will repeat your low-dose CT scan screening this fall Follow with Dr Lamonte Sakai in November 2017 with full PFT on the same day.

## 2016-03-03 NOTE — Progress Notes (Signed)
Subjective:    Patient ID: Susan Berger, female    DOB: 1961/01/07, 55 y.o.   MRN: [id number]  HPI 55 yo woman, former tobacco (33 pk-yrs), had CT scan of abdomen for renal stones in 7/12, found a 2.23mm RLL nodule. Referred for the nodule. She tells me that she has had cough, clear sputum. Also some exertional SOB.   ROV 10/03/11 -- f/u visit, tobacco hx and nodule by CT scan 7/12. She is doing well, no breathing complaints. 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 CT scan done 11/20 = 31mm R apical nodule.   ROV 10/23/12 -- here to f/u tobacco use, mild AFL, R apical nodule on CT scan 08/2011. Repeat scan 09/24/12 >> no change in the R apical nodule. She uses albuterol sometimes to pretreat exercise.  No exacerbations  ROV 10/23/13 -- follows up for mild AFL, R apical nodule seen on CT scan 08/2011. Repeat CT scan 09/2013 > nodule is stable for 2 yrs. She has stable exertional sob. Uses albuterol 2-3x a week.   ROV 05/20/14 --  follows up for mild AFL, R apical nodule seen on CT scan 08/2011 that was stable for 2+ years.  She tried spiriva but it didn't help her so she stopped. She is using albuterol prn, not every day. It helps with clearing secretions. She would qualify for LDCT screening.    ROV 07/07/15 -- Follow-up visit for mild obstructive lung disease, stable right apical nodule originally identified in 2012. At our last visit we stopped Spiriva, continued albuterol as needed.  Not smoking. Able to be active. Breathing does not limit her unless she walks a long distance. She does have cough, every day especially in the am prod clear. She has a lot of nasal congestion and sinus fullness.   She is undergone low-dose CT scan screening in September 2015 with a RADS-2 scan.   ROV 03/03/16 -- patient has a history of mild obstructive lung disease and abnormal CT scan of the chest as detailed above. She has been dissipating in the lung cancer screening program and had a RADS-2 scan in October  2016. She is due for a repeat scan in October of this year. At her last visit we restarted Spiriva to see if she would benefit. She believes that it has helped her - able to exert better. She is having some wheeze at night. She coughs in the am, has a lot of nasal drainage. She uses albuterol a lot of mornings.        Objective:   Physical Exam Filed Vitals:   03/03/16 1026  BP: 120/84  Pulse: 66  Height: 5\' 6"  (1.676 m)  Weight: 165 lb (74.844 kg)  SpO2: 99%   Gen: Pleasant, well-nourished, in no distress,  normal affect  ENT: No lesions,  mouth clear,  oropharynx clear, no postnasal drip  Neck: No JVD, no TMG, no carotid bruits  Lungs: No use of accessory muscles, no dullness to percussion, clear without rales or rhonchi  Cardiovascular: RRR, heart sounds normal, no murmur or gallops, no peripheral edema  Musculoskeletal: No deformities, no cyanosis or clubbing  Neuro: alert, non focal  Skin: Warm, no lesions or rash   CT chest 09/24/12 --  Comparison: 09/06/2011  Findings: Lungs/pleura: Mild changes of centrilobular emphysema.  No pleural effusion identified. Stable scar in the right lateral  lung base. The 4 mm nodule in the right apex is stable, image 9.  No new pulmonary nodules identified.  Heart/Mediastinum: No enlarged mediastinal or hilar lymph nodes.  Heart size is normal. No pericardial effusion.  Upper abdomen: No abnormal findings identified.  Bones/Musculoskeletal: No abnormalities identified.  IMPRESSION:  1. Stable 4 mm nodule in the right lung apex. This is most likely  benign.  2. Emphysema.      Assessment & Plan:  COPD (chronic obstructive pulmonary disease) Please continue Spiriva every day as you are taking it Please continue albuterol 2 puffs every 4 hours if needed for shortness of breath. We will repeat your pulmonary function testing at your next office visit in November Follow with Dr Lamonte Sakai in November 2018 with full PFT on the same  day.  Hx of tobacco use, presenting hazards to health Low-dose CT scan screening, currently a RADS-2. Due for repeat scanning fall 2017  Pulmonary nodule, right Stable by serial CTs.  Allergic rhinitis Continue over-the-counter allergy pill Start fluticasone nasal spray once a day   Baltazar Apo, MD, PhD 03/03/2016, 10:50 AM [facility] [phone] or if no answer [phone]

## 2016-03-03 NOTE — Assessment & Plan Note (Signed)
Continue over-the-counter allergy pill Start fluticasone nasal spray once a day

## 2016-03-16 ENCOUNTER — Other Ambulatory Visit: Payer: Self-pay | Admitting: Emergency Medicine

## 2016-04-12 ENCOUNTER — Other Ambulatory Visit: Payer: Self-pay | Admitting: *Deleted

## 2016-04-12 MED ORDER — FLUTICASONE PROPIONATE 50 MCG/ACT NA SUSP: 2.0000 | Freq: Every day | NASAL | Status: DC

## 2016-08-29 ENCOUNTER — Telehealth: Payer: Self-pay | Admitting: Emergency Medicine

## 2016-08-29 NOTE — Telephone Encounter (Signed)
RB do you want the pt to have the low dose CT prior to her appt with you on 11/20?  Please advise. thanks

## 2016-08-31 NOTE — Telephone Encounter (Signed)
Yes I'd like for her to go ahead and get the LDCT - please talk to Eric Form to insure that we order the scan correctly. Thanks.

## 2016-08-31 NOTE — Telephone Encounter (Signed)
Spoke with pt. She is aware that we will be setting this CT up for her. Will route message to the lung nodule pool.

## 2016-09-05 ENCOUNTER — Encounter: Payer: Self-pay | Admitting: Emergency Medicine

## 2016-09-05 ENCOUNTER — Ambulatory Visit (INDEPENDENT_AMBULATORY_CARE_PROVIDER_SITE_OTHER): Payer: 59 | Admitting: Emergency Medicine

## 2016-09-05 DIAGNOSIS — Z23 Encounter for immunization: Secondary | ICD-10-CM | POA: Diagnosis not present

## 2016-09-05 DIAGNOSIS — R911 Solitary pulmonary nodule: Secondary | ICD-10-CM

## 2016-09-05 DIAGNOSIS — J449 Chronic obstructive pulmonary disease, unspecified: Secondary | ICD-10-CM

## 2016-09-05 DIAGNOSIS — Z87891 Personal history of nicotine dependence: Secondary | ICD-10-CM

## 2016-09-05 LAB — PULMONARY FUNCTION TEST
DL/VA % PRED: 71 %
DL/VA: 3.7 ml/min/mmHg/L
DLCO cor % pred: 62 %
DLCO cor: 17.73 ml/min/mmHg
DLCO unc % pred: 63 %
DLCO unc: 17.84 ml/min/mmHg
FEF 25-75 PRE: 0.93 L/s
FEF 25-75 Post: 1.22 L/sec
FEF2575-%CHANGE-POST: 30 %
FEF2575-%Pred-Post: 43 %
FEF2575-%Pred-Pre: 33 %
FEV1-%Change-Post: 9 %
FEV1-%Pred-Post: 64 %
FEV1-%Pred-Pre: 58 %
FEV1-PRE: 1.76 L
FEV1-Post: 1.93 L
FEV1FVC-%Change-Post: 3 %
FEV1FVC-%Pred-Pre: 78 %
FEV6-%CHANGE-POST: 5 %
FEV6-%Pred-Post: 79 %
FEV6-%Pred-Pre: 74 %
FEV6-PRE: 2.8 L
FEV6-Post: 2.95 L
FEV6FVC-%Change-Post: 0 %
FEV6FVC-%PRED-PRE: 101 %
FEV6FVC-%Pred-Post: 101 %
FVC-%Change-Post: 5 %
FVC-%PRED-POST: 78 %
FVC-%PRED-PRE: 73 %
FVC-POST: 3 L
FVC-Pre: 2.83 L
POST FEV6/FVC RATIO: 99 %
Post FEV1/FVC ratio: 64 %
Pre FEV1/FVC ratio: 62 %
Pre FEV6/FVC Ratio: 99 %
RV % PRED: 131 %
RV: 2.64 L
TLC % pred: 109 %
TLC: 6.02 L

## 2016-09-05 MED ORDER — TIOTROPIUM BROMIDE-OLODATEROL 2.5-2.5 MCG/ACT IN AERS: 2.0000 | INHALATION_SPRAY | Freq: Every day | RESPIRATORY_TRACT | 0 refills | Status: DC

## 2016-09-05 NOTE — Assessment & Plan Note (Addendum)
Progression of disease based on her PFT today. She stopped spiriva 2 months ago. Would like to try her on stiolto to see if she benefits. If so then will continue this (vs Anoro depending on insurance). Continue albuterol prn.  Pneumovax today, prevnar in 1 year

## 2016-09-05 NOTE — Patient Instructions (Addendum)
We will start Stiolto, 2 puffs once a day for a month to see if you benefit.  Keep albuterol available to use as needed.  We will arrange for your CT chest to be done and then call you with the results.  Take albuterol 2 puffs up to every 4 hours if needed for shortness of breath.  We will give pneumovax today. We will plan to give you the Prevnar-13 pneumonia shot next year.  Follow with Dr Lamonte Sakai in 6 months or sooner if you have any problems

## 2016-09-05 NOTE — Addendum Note (Signed)
Addended by: Desmond Dike C on: 09/05/2016 10:52 AM   Modules accepted: Orders

## 2016-09-05 NOTE — Assessment & Plan Note (Signed)
Continue LDCT screening, next scan due now.

## 2016-09-05 NOTE — Progress Notes (Signed)
Subjective:    Patient ID: Susan Berger, female    DOB: 1960/12/09, 55 y.o.   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 Referred for the nodule. She tells me that she has had cough, clear sputum. Also some exertional SOB.   ROV 10/03/11 -- f/u visit, tobacco hx and nodule by CT scan 7/12. She is doing well, no breathing complaints. 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 CT scan done 11/20 = 20mm R apical nodule.   ROV 10/23/12 -- here to f/u tobacco use, mild AFL, R apical nodule on CT scan 08/2011. Repeat scan 09/24/12 >> no change in the R apical nodule. She uses albuterol sometimes to pretreat exercise.  No exacerbations  ROV 10/23/13 -- follows up for mild AFL, R apical nodule seen on CT scan 08/2011. Repeat CT scan 09/2013 > nodule is stable for 2 yrs. She has stable exertional sob. Uses albuterol 2-3x a week.   ROV 05/20/14 --  follows up for mild AFL, R apical nodule seen on CT scan 08/2011 that was stable for 2+ years.  She tried spiriva but it didn't help her so she stopped. She is using albuterol prn, not every day. It helps with clearing secretions. She would qualify for LDCT screening.    ROV 07/07/15 -- Follow-up visit for mild obstructive lung disease, stable right apical nodule originally identified in 2012. At our last visit we stopped Spiriva, continued albuterol as needed.  Not smoking. Able to be active. Breathing does not limit her unless she walks a long distance. She does have cough, every day especially in the am prod clear. She has a lot of nasal congestion and sinus fullness.   She is undergone low-dose CT scan screening in September 2015 with a RADS-2 scan.   ROV 03/03/16 -- patient has a history of mild obstructive lung disease and abnormal CT scan of the chest as detailed above. She has been participating in the lung cancer screening program and had a RADS-2 scan in October  2016. She is due for a repeat scan in October of this year. At her last visit we restarted Spiriva to see if she would benefit. She believes that it has helped her - able to exert better. She is having some wheeze at night. She coughs in the am, has a lot of nasal drainage. She uses albuterol a lot of mornings.    ROV 09/05/16 -- the patient follows up today for mild COPD and hx tobacco use, prior apical R nodule. She underwent LDCT on 08/13/15 that I have reviewed. This was a RADS 2 scan, no new alarming findings. She is due for her repeat scan now.  She reports that she was just treated for an AE by her PCP with abx, no prednisone. Finished last week. 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 She has been managed on Spiriva. She ran out of spiriva about 2 months ago and feels that she misses. Minimal exertional sx. No current wheeze or cough.       Objective:   Physical Exam Vitals:   09/05/16 1014 09/05/16 1015  BP:  (!) 130/92  BP Location:  Left Arm  Cuff Size:  Normal  Pulse:  78  SpO2:  99%  Weight: 166 lb (75.3 kg)   Height: 5\' 7"  (1.702 m)    Gen: Pleasant, well-nourished, in no distress,  normal affect  ENT: No lesions,  mouth clear,  oropharynx clear, no postnasal drip  Neck: No JVD, no TMG, no carotid bruits  Lungs: No use of accessory muscles, no dullness to percussion, clear without rales or rhonchi  Cardiovascular: RRR, heart sounds normal, no murmur or gallops, no peripheral edema  Musculoskeletal: No deformities, no cyanosis or clubbing  Neuro: alert, non focal  Skin: Warm, no lesions or rash  08/12/16 --  FINDINGS: Mediastinum: The heart size appears normal. Aortic atherosclerosis. There is no pericardial effusion. The trachea is patent and appears midline. Normal appearance of the esophagus. There is no mediastinal or hilar  adenopathy identified. No axillary or supraclavicular adenopathy.  Lungs/Pleura: There is no pleural fluid noted. Changes of centrilobular emphysema noted. Scar like density is a noted in the posterior right lower lobe. A few tiny nodules are identified. The largest is in the right lower lobe and has an equivalent diameter of 2.5 mm, image 190 of series 5.  Upper Abdomen: There is no suspicious liver abnormality identified. The adrenal glands are both normal. Unremarkable appearance of the spleen.  Musculoskeletal: There is mild degenerative disc disease within the thoracic spine. No aggressive lytic or sclerotic bone lesions identified.  IMPRESSION: 1. Lung-RADS Category 2, benign appearance or behavior. Continue annual screening with low-dose chest CT without contrast in 12 months 2. Emphysema 3. Aortic atherosclerosis      Assessment & Plan:  Pulmonary nodule, right Now following with the LDCT screening program. Due for repeat scan now.   COPD (chronic obstructive pulmonary disease) Progression of disease based on her PFT today. She stopped spiriva 2 months ago. Would like to try her on stiolto to see if she benefits. If so then will continue this (vs Anoro depending on insurance). Continue albuterol prn.  Pneumovax today, prevnar in 1 year  Hx of tobacco use, presenting hazards to health Continue LDCT screening, next scan due now.   Baltazar Apo, MD, PhD 09/05/2016, 10:33 AM [facility] [phone] or if no answer [phone]

## 2016-09-05 NOTE — Assessment & Plan Note (Signed)
Now following with the LDCT screening program. Due for repeat scan now.

## 2016-09-17 ENCOUNTER — Other Ambulatory Visit: Payer: Self-pay | Admitting: Emergency Medicine

## 2016-09-22 ENCOUNTER — Telehealth: Payer: Self-pay | Admitting: Emergency Medicine

## 2016-09-22 DIAGNOSIS — J438 Other emphysema: Secondary | ICD-10-CM

## 2016-09-22 MED ORDER — TIOTROPIUM BROMIDE-OLODATEROL 2.5-2.5 MCG/ACT IN AERS: 2.0000 | INHALATION_SPRAY | Freq: Every day | RESPIRATORY_TRACT | 5 refills | Status: DC

## 2016-09-22 NOTE — Telephone Encounter (Signed)
Spoke with pt. She is needing a refill on Stiolto. This has been sent in. She is also requesting to be referred to pulmonary rehab.  RB - please advise if you are okay with referring pt to pulmonary rehab. Thanks.

## 2016-09-23 ENCOUNTER — Telehealth: Payer: Self-pay | Admitting: Emergency Medicine

## 2016-09-23 NOTE — Telephone Encounter (Signed)
Called and spoke with pt and she is aware that we do not have any samples of the stiolto.  RB please advise if we need to change this medication.   Also advise about setting the pt up with Pulm rehab. thanks

## 2016-09-23 NOTE — Telephone Encounter (Signed)
Pt calling this morning wanting to know if we have samples of medication, says she knows that she will need a PA on it please advise.Susan Berger

## 2016-09-23 NOTE — Telephone Encounter (Signed)
PA was started through [facility] on Stiolto. Key: [id number]. PA is pending. Will check CMM next week for determination.

## 2016-09-26 NOTE — Telephone Encounter (Signed)
lmtcb X1 for pt to obtain formulary from patient.   RB please advise on pulmonary rehab. Thanks!

## 2016-09-26 NOTE — Telephone Encounter (Signed)
Please find out if her insurance formulary includes either Stiolto or anoro. I would like to keep her on whichever one is covered.

## 2016-09-27 NOTE — Telephone Encounter (Signed)
Ok to refer to pulm rehab

## 2016-09-27 NOTE — Telephone Encounter (Signed)
Pt states stiolto is covered, pt picked up Rx from the pharmacy on Saturday. RB would you like the pulm rehab ref associated with COPD, is pt GOLD 2,3 or 4?  RB please advise. Thanks.

## 2016-09-28 NOTE — Telephone Encounter (Signed)
Please refer her to pulm rehab. The dx is emphysema.

## 2016-09-28 NOTE — Telephone Encounter (Signed)
Order placed. Pt aware and voiced understanding. Nothing further needed.

## 2016-09-29 NOTE — Telephone Encounter (Signed)
Checked [employer]. PA has been approved. I have called pt's pharmacy and made them aware of this. Nothing further was needed.

## 2016-10-03 ENCOUNTER — Telehealth: Payer: Self-pay | Admitting: Emergency Medicine

## 2016-10-03 NOTE — Telephone Encounter (Signed)
Called and spoke to pt. Pt wanted to give FYI that pulm rehab is looking into how much she will have to pay. Nothing further needed at this time.

## 2016-10-03 NOTE — Telephone Encounter (Signed)
ATC to pt, VM has not been set up yet. Wanted to inform the pt that there is a several week wait to the lung cancer screening.

## 2016-10-19 NOTE — Telephone Encounter (Signed)
Screening CTs aren't typically paid for by insurance without being part of the lung screening program and patient is too young for the program (cut-off age is 65)  Called spoke with patient patient about this, she apparently was already aware Will forward to SG to ask her opinion on pt having LDCT   Pt's chart reports is is a former smoker as of 03/2011 with a 80yr pack history Judson Roch, can you offer and direction?  Thank you!

## 2016-10-19 NOTE — Telephone Encounter (Signed)
She can not be a part of the program until she is 41, as she does not meet the CMS criteria.  She can have her PCP order the scan through [facility] for $299.00 out of pocket cost.Once she is 57 her PCP can refer to the program and it should then be a  Covered benefit, but it does differ based on individual Medicare plans. Tell her we look forward to seeing her when she is 49.Thanks so much.

## 2016-10-20 NOTE — Telephone Encounter (Signed)
Spoke with patient, aware of rec's per SG and specific criteria that she must meet to be in the [employer]. Aware that she can have her PCP refer her later to the program after she reaches 56 years of age. Pt expressed understanding. Nothing further needed.

## 2016-11-15 DIAGNOSIS — D2371 Other benign neoplasm of skin of right lower limb, including hip: Secondary | ICD-10-CM | POA: Diagnosis not present

## 2016-11-15 DIAGNOSIS — L820 Inflamed seborrheic keratosis: Secondary | ICD-10-CM | POA: Diagnosis not present

## 2016-11-15 DIAGNOSIS — L821 Other seborrheic keratosis: Secondary | ICD-10-CM | POA: Diagnosis not present

## 2016-11-15 DIAGNOSIS — L570 Actinic keratosis: Secondary | ICD-10-CM | POA: Diagnosis not present

## 2016-11-28 DIAGNOSIS — Z1231 Encounter for screening mammogram for malignant neoplasm of breast: Secondary | ICD-10-CM | POA: Diagnosis not present

## 2016-12-13 ENCOUNTER — Ambulatory Visit: Payer: 59 | Admitting: Podiatry

## 2016-12-26 ENCOUNTER — Ambulatory Visit: Payer: 59

## 2016-12-26 ENCOUNTER — Ambulatory Visit (INDEPENDENT_AMBULATORY_CARE_PROVIDER_SITE_OTHER): Payer: 59 | Admitting: Podiatry

## 2016-12-26 ENCOUNTER — Ambulatory Visit (INDEPENDENT_AMBULATORY_CARE_PROVIDER_SITE_OTHER): Payer: 59

## 2016-12-26 ENCOUNTER — Encounter: Payer: Self-pay | Admitting: Podiatry

## 2016-12-26 VITALS — BP 145/84 | HR 95 | Ht 66.0 in | Wt 170.0 lb

## 2016-12-26 DIAGNOSIS — M79671 Pain in right foot: Secondary | ICD-10-CM

## 2016-12-26 DIAGNOSIS — M722 Plantar fascial fibromatosis: Secondary | ICD-10-CM

## 2016-12-26 DIAGNOSIS — M79672 Pain in left foot: Secondary | ICD-10-CM | POA: Diagnosis not present

## 2016-12-26 MED ORDER — TRIAMCINOLONE ACETONIDE 10 MG/ML IJ SUSP: 10.0000 mg | Freq: Once | INTRAMUSCULAR | Status: AC

## 2016-12-26 MED ORDER — DICLOFENAC SODIUM 75 MG PO TBEC: 75.0000 mg | DELAYED_RELEASE_TABLET | Freq: Two times a day (BID) | ORAL | 2 refills | Status: DC

## 2016-12-26 NOTE — Progress Notes (Signed)
Subjective:     Patient ID: Susan Berger, female   DOB: 01-22-1961, 56 y.o.   MRN: [id number]  HPI patient states she's had a lot of pain in the right heel with moderate in the left heel and states it's worse when she gets up in the morning and after periods of sitting. Patient states it's been getting gradually worse over the last few months   Review of Systems  All other systems reviewed and are negative.      Objective:   Physical Exam  Constitutional: She is oriented to person, place, and time.  Cardiovascular: Intact distal pulses.   Musculoskeletal: Normal range of motion.  Neurological: She is oriented to person, place, and time.  Skin: Skin is warm.  Nursing note and vitals reviewed.  neurovascular status intact muscle strength adequate range of motion within normal limits with patient found to have exquisite discomfort right plantar fashion insertional point tendon calcaneus with moderate discomfort in the left heel at the insertional point tendon into the calcaneus. Patient's found have moderate depression of the arch and no other pathology noted currently with diabetes under good control     Assessment:     Acute plantar fasciitis right over left heel    Plan:     H&P conditions reviewed and injected the plantar fascial right 3 mg Kenalog 5 mill grams Xylocaine and dispensed fascial brace bilateral placed on diclofenac 75 mg twice a day and reappoint in 2 weeks after discussing physical therapy  X-rays indicate small spur with no indication stress fracture arthritis

## 2016-12-26 NOTE — Patient Instructions (Signed)

## 2016-12-26 NOTE — Progress Notes (Signed)
   Subjective:    Patient ID: Susan Berger, female    DOB: 1961/08/19, 56 y.o.   MRN: [id number]  HPI Chief Complaint  Patient presents with  . Foot Pain    Bilateral; bottom of heel & Dorsal; pt stated, "Right foot hurts more than the left foot"; x2 months; pt Diabetic 2; Sugar=did not check today; A1C=7      Review of Systems  All other systems reviewed and are negative.      Objective:   Physical Exam        Assessment & Plan:

## 2017-01-03 ENCOUNTER — Telehealth: Payer: Self-pay | Admitting: Emergency Medicine

## 2017-01-03 MED ORDER — TIOTROPIUM BROMIDE-OLODATEROL 2.5-2.5 MCG/ACT IN AERS: 2.0000 | INHALATION_SPRAY | Freq: Every day | RESPIRATORY_TRACT | 3 refills | Status: DC

## 2017-01-03 NOTE — Telephone Encounter (Signed)
Rx for Stiolto sent to preferred pharmacy for 3 month supply with 3 refills. Nothing further needed.

## 2017-01-09 ENCOUNTER — Encounter: Payer: Self-pay | Admitting: Podiatry

## 2017-01-09 ENCOUNTER — Ambulatory Visit (INDEPENDENT_AMBULATORY_CARE_PROVIDER_SITE_OTHER): Payer: 59 | Admitting: Podiatry

## 2017-01-09 DIAGNOSIS — M722 Plantar fascial fibromatosis: Secondary | ICD-10-CM | POA: Diagnosis not present

## 2017-01-09 MED ORDER — TRIAMCINOLONE ACETONIDE 10 MG/ML IJ SUSP
10.0000 mg | Freq: Once | INTRAMUSCULAR | Status: AC
  Administered 2017-01-09: 10 mg

## 2017-01-10 NOTE — Progress Notes (Signed)
Subjective:     Patient ID: Susan Berger, female   DOB: [birth year], 56 y.o.   MRN: [id number]  HPI patient presents stating that she is doing well but her left heel remains tender   Review of Systems     Objective:   Physical Exam Neurovascular status intact negative Homans sign noted with patient found to have moderate discomfort left plantar fascia with the right doing much better and moderate depression of the arch    Assessment:     Continued plantar fasciitis left over right with the left not receiving medication    Plan:     Injected the left plantar fascia 3 Milligan Kenalog 5 mg Xylocaine and instructed on continued physical therapy and discussed long-term orthotics and reappoint in 3 weeks to reevaluate

## 2017-01-11 DIAGNOSIS — E119 Type 2 diabetes mellitus without complications: Secondary | ICD-10-CM | POA: Diagnosis not present

## 2017-01-11 DIAGNOSIS — E7800 Pure hypercholesterolemia, unspecified: Secondary | ICD-10-CM | POA: Diagnosis not present

## 2017-01-11 DIAGNOSIS — I10 Essential (primary) hypertension: Secondary | ICD-10-CM | POA: Diagnosis not present

## 2017-01-11 DIAGNOSIS — J449 Chronic obstructive pulmonary disease, unspecified: Secondary | ICD-10-CM | POA: Diagnosis not present

## 2017-01-16 ENCOUNTER — Other Ambulatory Visit: Payer: Self-pay | Admitting: Internal Medicine

## 2017-01-16 DIAGNOSIS — R918 Other nonspecific abnormal finding of lung field: Secondary | ICD-10-CM

## 2017-01-18 ENCOUNTER — Ambulatory Visit
Admission: RE | Admit: 2017-01-18 | Discharge: 2017-01-18 | Disposition: A | Discharge status: Home / Self Care | Payer: 59 | Source: Ambulatory Visit | Attending: Internal Medicine | Admitting: Internal Medicine

## 2017-01-18 ENCOUNTER — Other Ambulatory Visit: Payer: Self-pay | Admitting: Internal Medicine

## 2017-01-18 DIAGNOSIS — Z122 Encounter for screening for malignant neoplasm of respiratory organs: Secondary | ICD-10-CM

## 2017-01-18 DIAGNOSIS — Z87891 Personal history of nicotine dependence: Secondary | ICD-10-CM | POA: Diagnosis not present

## 2017-01-18 DIAGNOSIS — R918 Other nonspecific abnormal finding of lung field: Secondary | ICD-10-CM

## 2017-02-01 ENCOUNTER — Other Ambulatory Visit: Payer: Self-pay | Admitting: Emergency Medicine

## 2017-02-13 DIAGNOSIS — M4120 Other idiopathic scoliosis, site unspecified: Secondary | ICD-10-CM | POA: Diagnosis not present

## 2017-02-13 DIAGNOSIS — M47817 Spondylosis without myelopathy or radiculopathy, lumbosacral region: Secondary | ICD-10-CM | POA: Diagnosis not present

## 2017-02-13 DIAGNOSIS — M545 Low back pain: Secondary | ICD-10-CM | POA: Diagnosis not present

## 2017-02-15 DIAGNOSIS — M545 Low back pain: Secondary | ICD-10-CM | POA: Diagnosis not present

## 2017-02-15 DIAGNOSIS — M4120 Other idiopathic scoliosis, site unspecified: Secondary | ICD-10-CM | POA: Diagnosis not present

## 2017-02-15 DIAGNOSIS — M47817 Spondylosis without myelopathy or radiculopathy, lumbosacral region: Secondary | ICD-10-CM | POA: Diagnosis not present

## 2017-02-20 ENCOUNTER — Ambulatory Visit (INDEPENDENT_AMBULATORY_CARE_PROVIDER_SITE_OTHER): Payer: 59 | Admitting: Emergency Medicine

## 2017-02-20 ENCOUNTER — Encounter: Payer: Self-pay | Admitting: Emergency Medicine

## 2017-02-20 DIAGNOSIS — Z87891 Personal history of nicotine dependence: Secondary | ICD-10-CM

## 2017-02-20 DIAGNOSIS — J301 Allergic rhinitis due to pollen: Secondary | ICD-10-CM

## 2017-02-20 DIAGNOSIS — M47817 Spondylosis without myelopathy or radiculopathy, lumbosacral region: Secondary | ICD-10-CM | POA: Diagnosis not present

## 2017-02-20 DIAGNOSIS — M545 Low back pain: Secondary | ICD-10-CM | POA: Diagnosis not present

## 2017-02-20 DIAGNOSIS — J449 Chronic obstructive pulmonary disease, unspecified: Secondary | ICD-10-CM

## 2017-02-20 DIAGNOSIS — M4120 Other idiopathic scoliosis, site unspecified: Secondary | ICD-10-CM | POA: Diagnosis not present

## 2017-02-20 NOTE — Assessment & Plan Note (Signed)
Participating LDCT screening program. Due for screening November.

## 2017-02-20 NOTE — Patient Instructions (Addendum)
Please continue your Spiriva, loratadine and Flonase as you are taking them.  Take albuterol 2 puffs up to every 4 hours if needed for shortness of breath.  Get the flu shot in the Fall You can get the Prevnar-13 shot in the end of 2018.  Follow with Dr Lamonte Sakai in 6 months or sooner if you have any problems

## 2017-02-20 NOTE — Assessment & Plan Note (Signed)
Loratadine and flonase

## 2017-02-20 NOTE — Progress Notes (Signed)
Subjective:    Patient ID: Susan Berger, female    DOB: 1961/06/28, 56 y.o.   MRN: [id number]  HPI 56 yo woman, former tobacco (33 pk-yrs), had CT scan of abdomen for renal stones in 7/12, found a 2.65mm RLL nodule. Referred for the nodule. She tells me that she has had cough, clear sputum. Also some exertional SOB.   ROV 10/03/11 -- f/u visit, tobacco hx and nodule by CT scan 7/12. She is doing well, no breathing complaints. 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 CT scan done 11/20 = 82mm R apical nodule.   ROV 10/23/12 -- here to f/u tobacco use, mild AFL, R apical nodule on CT scan 08/2011. Repeat scan 09/24/12 >> no change in the R apical nodule. She uses albuterol sometimes to pretreat exercise.  No exacerbations  ROV 10/23/13 -- follows up for mild AFL, R apical nodule seen on CT scan 08/2011. Repeat CT scan 09/2013 > nodule is stable for 2 yrs. She has stable exertional sob. Uses albuterol 2-3x a week.   ROV 05/20/14 --  follows up for mild AFL, R apical nodule seen on CT scan 08/2011 that was stable for 2+ years.  She tried spiriva but it didn't help her so she stopped. She is using albuterol prn, not every day. It helps with clearing secretions. She would qualify for LDCT screening.    ROV 07/07/15 -- Follow-up visit for mild obstructive lung disease, stable right apical nodule originally identified in 2012. At our last visit we stopped Spiriva, continued albuterol as needed.  Not smoking. Able to be active. Breathing does not limit her unless she walks a long distance. She does have cough, every day especially in the am prod clear. She has a lot of nasal congestion and sinus fullness.   She is undergone low-dose CT scan screening in September 2015 with a RADS-2 scan.   ROV 03/03/16 -- patient has a history of mild obstructive lung disease and abnormal CT scan of the chest as detailed above. She has been participating in the lung cancer screening program and had a RADS-2 scan in October  2016. She is due for a repeat scan in October of this year. At her last visit we restarted Spiriva to see if she would benefit. She believes that it has helped her - able to exert better. She is having some wheeze at night. She coughs in the am, has a lot of nasal drainage. She uses albuterol a lot of mornings.    ROV 09/05/16 -- the patient follows up today for mild COPD and hx tobacco use, prior apical R nodule. She underwent LDCT on 08/13/15 that I have reviewed. This was a RADS 2 scan, no new alarming findings. She is due for her repeat scan now.  She reports that she was just treated for an AE by her PCP with abx, no prednisone. Finished last week. 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 She has been managed on Spiriva. She ran out of spiriva about 2 months ago and feels that she misses. Minimal exertional sx. No current wheeze or cough.   ROV 02/20/17 -- this is a follow-up visit for history of COPD and former tobacco use. Spirometry with obstruction as above. She is having a bit more PND, sneezing, some cough. She is on loratadine and flonase. She has been  taking Spiriva reliably - feels that her breathing is better. No flares since last time. Exercise tolerance is good. Uses albuterol about every other day, pre-treats exercise.      Objective:   Physical Exam Vitals:   02/20/17 0918  BP: 114/74  Pulse: 97  SpO2: 99%  Weight: 163 lb (73.9 kg)  Height: 5\' 7"  (1.702 m)   Gen: Pleasant, well-nourished, in no distress,  normal affect  ENT: No lesions,  mouth clear,  oropharynx clear, no postnasal drip  Neck: No JVD, no TMG, no carotid bruits  Lungs: No use of accessory muscles, no dullness to percussion, clear without rales or rhonchi  Cardiovascular: RRR, heart sounds normal, no murmur or gallops, no peripheral edema  Musculoskeletal: No deformities,  no cyanosis or clubbing  Neuro: alert, non focal  Skin: Warm, no lesions or rash  08/12/16 --  FINDINGS: Mediastinum: The heart size appears normal. Aortic atherosclerosis. There is no pericardial effusion. The trachea is patent and appears midline. Normal appearance of the esophagus. There is no mediastinal or hilar adenopathy identified. No axillary or supraclavicular adenopathy.  Lungs/Pleura: There is no pleural fluid noted. Changes of centrilobular emphysema noted. Scar like density is a noted in the posterior right lower lobe. A few tiny nodules are identified. The largest is in the right lower lobe and has an equivalent diameter of 2.5 mm, image 190 of series 5.  Upper Abdomen: There is no suspicious liver abnormality identified. The adrenal glands are both normal. Unremarkable appearance of the spleen.  Musculoskeletal: There is mild degenerative disc disease within the thoracic spine. No aggressive lytic or sclerotic bone lesions identified.  IMPRESSION: 1. Lung-RADS Category 2, benign appearance or behavior. Continue annual screening with low-dose chest CT without contrast in 12 months 2. Emphysema 3. Aortic atherosclerosis      Assessment & Plan:  Hx of tobacco use, presenting hazards to health Participating LDCT screening program. Due for screening November.   COPD (chronic obstructive pulmonary disease) Please continue your Spiriva, loratadine and Flonase as you are taking them.  Take albuterol 2 puffs up to every 4 hours if needed for shortness of breath.  Get the flu shot in the Fall You can get the Prevnar-13 shot in the end of 2018.  Follow with Dr Lamonte Sakai in 6 months or sooner if you have any problems  Allergic rhinitis Loratadine and flonase   Baltazar Apo, MD, PhD 02/20/2017, 9:33 AM [facility] [phone] or if no answer [phone]

## 2017-02-20 NOTE — Assessment & Plan Note (Signed)
Please continue your Spiriva, loratadine and Flonase as you are taking them.  Take albuterol 2 puffs up to every 4 hours if needed for shortness of breath.  Get the flu shot in the Fall You can get the Prevnar-13 shot in the end of 2018.  Follow with Dr Lamonte Sakai in 6 months or sooner if you have any problems

## 2017-02-27 DIAGNOSIS — M6240 Contracture of muscle, unspecified site: Secondary | ICD-10-CM | POA: Diagnosis not present

## 2017-02-27 DIAGNOSIS — M542 Cervicalgia: Secondary | ICD-10-CM | POA: Diagnosis not present

## 2017-02-27 DIAGNOSIS — M2550 Pain in unspecified joint: Secondary | ICD-10-CM | POA: Diagnosis not present

## 2017-03-01 DIAGNOSIS — M542 Cervicalgia: Secondary | ICD-10-CM | POA: Diagnosis not present

## 2017-03-01 DIAGNOSIS — M2550 Pain in unspecified joint: Secondary | ICD-10-CM | POA: Diagnosis not present

## 2017-03-01 DIAGNOSIS — M6240 Contracture of muscle, unspecified site: Secondary | ICD-10-CM | POA: Diagnosis not present

## 2017-03-07 DIAGNOSIS — M6240 Contracture of muscle, unspecified site: Secondary | ICD-10-CM | POA: Diagnosis not present

## 2017-03-07 DIAGNOSIS — M542 Cervicalgia: Secondary | ICD-10-CM | POA: Diagnosis not present

## 2017-03-07 DIAGNOSIS — M2550 Pain in unspecified joint: Secondary | ICD-10-CM | POA: Diagnosis not present

## 2017-03-09 DIAGNOSIS — M542 Cervicalgia: Secondary | ICD-10-CM | POA: Diagnosis not present

## 2017-03-09 DIAGNOSIS — M2550 Pain in unspecified joint: Secondary | ICD-10-CM | POA: Diagnosis not present

## 2017-03-09 DIAGNOSIS — M62838 Other muscle spasm: Secondary | ICD-10-CM | POA: Diagnosis not present

## 2017-03-11 ENCOUNTER — Other Ambulatory Visit: Payer: Self-pay | Admitting: Emergency Medicine

## 2017-03-14 DIAGNOSIS — M6240 Contracture of muscle, unspecified site: Secondary | ICD-10-CM | POA: Diagnosis not present

## 2017-03-14 DIAGNOSIS — M542 Cervicalgia: Secondary | ICD-10-CM | POA: Diagnosis not present

## 2017-03-14 DIAGNOSIS — M2550 Pain in unspecified joint: Secondary | ICD-10-CM | POA: Diagnosis not present

## 2017-03-16 DIAGNOSIS — M62838 Other muscle spasm: Secondary | ICD-10-CM | POA: Diagnosis not present

## 2017-03-16 DIAGNOSIS — M2550 Pain in unspecified joint: Secondary | ICD-10-CM | POA: Diagnosis not present

## 2017-03-16 DIAGNOSIS — M6240 Contracture of muscle, unspecified site: Secondary | ICD-10-CM | POA: Diagnosis not present

## 2017-03-20 DIAGNOSIS — M2550 Pain in unspecified joint: Secondary | ICD-10-CM | POA: Diagnosis not present

## 2017-03-20 DIAGNOSIS — M6240 Contracture of muscle, unspecified site: Secondary | ICD-10-CM | POA: Diagnosis not present

## 2017-03-20 DIAGNOSIS — M542 Cervicalgia: Secondary | ICD-10-CM | POA: Diagnosis not present

## 2017-03-23 DIAGNOSIS — M542 Cervicalgia: Secondary | ICD-10-CM | POA: Diagnosis not present

## 2017-03-23 DIAGNOSIS — M2550 Pain in unspecified joint: Secondary | ICD-10-CM | POA: Diagnosis not present

## 2017-03-23 DIAGNOSIS — M6240 Contracture of muscle, unspecified site: Secondary | ICD-10-CM | POA: Diagnosis not present

## 2017-03-27 DIAGNOSIS — M542 Cervicalgia: Secondary | ICD-10-CM | POA: Diagnosis not present

## 2017-03-27 DIAGNOSIS — M2550 Pain in unspecified joint: Secondary | ICD-10-CM | POA: Diagnosis not present

## 2017-03-27 DIAGNOSIS — M62838 Other muscle spasm: Secondary | ICD-10-CM | POA: Diagnosis not present

## 2017-03-30 DIAGNOSIS — M2550 Pain in unspecified joint: Secondary | ICD-10-CM | POA: Diagnosis not present

## 2017-03-30 DIAGNOSIS — M6240 Contracture of muscle, unspecified site: Secondary | ICD-10-CM | POA: Diagnosis not present

## 2017-03-30 DIAGNOSIS — M542 Cervicalgia: Secondary | ICD-10-CM | POA: Diagnosis not present

## 2017-04-04 DIAGNOSIS — M2550 Pain in unspecified joint: Secondary | ICD-10-CM | POA: Diagnosis not present

## 2017-04-04 DIAGNOSIS — M542 Cervicalgia: Secondary | ICD-10-CM | POA: Diagnosis not present

## 2017-04-04 DIAGNOSIS — M62838 Other muscle spasm: Secondary | ICD-10-CM | POA: Diagnosis not present

## 2017-04-17 DIAGNOSIS — M6240 Contracture of muscle, unspecified site: Secondary | ICD-10-CM | POA: Diagnosis not present

## 2017-04-17 DIAGNOSIS — M542 Cervicalgia: Secondary | ICD-10-CM | POA: Diagnosis not present

## 2017-04-17 DIAGNOSIS — M2550 Pain in unspecified joint: Secondary | ICD-10-CM | POA: Diagnosis not present

## 2017-04-24 DIAGNOSIS — M6240 Contracture of muscle, unspecified site: Secondary | ICD-10-CM | POA: Diagnosis not present

## 2017-04-24 DIAGNOSIS — M62838 Other muscle spasm: Secondary | ICD-10-CM | POA: Diagnosis not present

## 2017-04-24 DIAGNOSIS — M2550 Pain in unspecified joint: Secondary | ICD-10-CM | POA: Diagnosis not present

## 2017-05-08 DIAGNOSIS — M6240 Contracture of muscle, unspecified site: Secondary | ICD-10-CM | POA: Diagnosis not present

## 2017-05-08 DIAGNOSIS — M2550 Pain in unspecified joint: Secondary | ICD-10-CM | POA: Diagnosis not present

## 2017-05-08 DIAGNOSIS — M542 Cervicalgia: Secondary | ICD-10-CM | POA: Diagnosis not present

## 2017-05-22 DIAGNOSIS — M2550 Pain in unspecified joint: Secondary | ICD-10-CM | POA: Diagnosis not present

## 2017-05-22 DIAGNOSIS — M47817 Spondylosis without myelopathy or radiculopathy, lumbosacral region: Secondary | ICD-10-CM | POA: Diagnosis not present

## 2017-05-22 DIAGNOSIS — M542 Cervicalgia: Secondary | ICD-10-CM | POA: Diagnosis not present

## 2017-06-05 ENCOUNTER — Other Ambulatory Visit: Payer: Self-pay | Admitting: Emergency Medicine

## 2017-06-28 ENCOUNTER — Encounter: Payer: Self-pay | Admitting: Emergency Medicine

## 2017-06-28 ENCOUNTER — Ambulatory Visit (INDEPENDENT_AMBULATORY_CARE_PROVIDER_SITE_OTHER): Payer: 59 | Admitting: Emergency Medicine

## 2017-06-28 DIAGNOSIS — Z23 Encounter for immunization: Secondary | ICD-10-CM

## 2017-06-28 DIAGNOSIS — J449 Chronic obstructive pulmonary disease, unspecified: Secondary | ICD-10-CM | POA: Diagnosis not present

## 2017-06-28 DIAGNOSIS — R911 Solitary pulmonary nodule: Secondary | ICD-10-CM

## 2017-06-28 NOTE — Patient Instructions (Addendum)
We will continue Spiriva once a day Take albuterol 2 puffs up to every 4 hours if needed for shortness of breath.  Flu shot today Prevnar 13 today.  Low dose CT scan in the Spring 2019.  Follow with Dr Lamonte Sakai in 6 months or sooner if you have any problems

## 2017-06-28 NOTE — Assessment & Plan Note (Signed)
We will continue Spiriva once a day Take albuterol 2 puffs up to every 4 hours if needed for shortness of breath.  Flu shot today Prevnar 13 today.  Follow with Dr Lamonte Sakai in 6 months or sooner if you have any problems

## 2017-06-28 NOTE — Assessment & Plan Note (Signed)
Due for LDCT screening in Spring 2019. We will order this.

## 2017-06-28 NOTE — Progress Notes (Signed)
Subjective:    Patient ID: Susan Berger, female    DOB: 22-Jul-1961, 56 y.o.   MRN: [id number]  HPI 56 yo woman, former tobacco (33 pk-yrs), had CT scan of abdomen for renal stones in 7/12, found a 2.41mm RLL nodule. Referred for the nodule. She tells me that she has had cough, clear sputum. Also some exertional SOB.   ROV 10/03/11 -- f/u visit, tobacco hx and nodule by CT scan 7/12. She is doing well, no breathing complaints. 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 CT scan done 11/20 = 10mm R apical nodule.   ROV 10/23/12 -- here to f/u tobacco use, mild AFL, R apical nodule on CT scan 08/2011. Repeat scan 09/24/12 >> no change in the R apical nodule. She uses albuterol sometimes to pretreat exercise.  No exacerbations  ROV 10/23/13 -- follows up for mild AFL, R apical nodule seen on CT scan 08/2011. Repeat CT scan 09/2013 > nodule is stable for 2 yrs. She has stable exertional sob. Uses albuterol 2-3x a week.   ROV 05/20/14 --  follows up for mild AFL, R apical nodule seen on CT scan 08/2011 that was stable for 2+ years.  She tried spiriva but it didn't help her so she stopped. She is using albuterol prn, not every day. It helps with clearing secretions. She would qualify for LDCT screening.    ROV 07/07/15 -- Follow-up visit for mild obstructive lung disease, stable right apical nodule originally identified in 2012. At our last visit we stopped Spiriva, continued albuterol as needed.  Not smoking. Able to be active. Breathing does not limit her unless she walks a long distance. She does have cough, every day especially in the am prod clear. She has a lot of nasal congestion and sinus fullness.   She is undergone low-dose CT scan screening in September 2015 with a RADS-2 scan.   ROV 03/03/16 -- patient has a history of mild obstructive lung disease and abnormal CT scan of the chest as detailed above. She has been participating in the lung cancer screening program and had a RADS-2 scan in October  2016. She is due for a repeat scan in October of this year. At her last visit we restarted Spiriva to see if she would benefit. She believes that it has helped her - able to exert better. She is having some wheeze at night. She coughs in the am, has a lot of nasal drainage. She uses albuterol a lot of mornings.    ROV 09/05/16 -- the patient follows up today for mild COPD and hx tobacco use, prior apical R nodule. She underwent LDCT on 08/13/15 that I have reviewed. This was a RADS 2 scan, no new alarming findings. She is due for her repeat scan now.  She reports that she was just treated for an AE by her PCP with abx, no prednisone. Finished last week. 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 She has been managed on Spiriva. She ran out of spiriva about 2 months ago and feels that she misses. Minimal exertional sx. No current wheeze or cough.   ROV 02/20/17 -- this is a follow-up visit for history of COPD and former tobacco use. Spirometry with obstruction as above. She is having a bit more PND, sneezing, some cough. She is on loratadine and flonase. She has been  taking Spiriva reliably - feels that her breathing is better. No flares since last time. Exercise tolerance is good. Uses albuterol about every other day, pre-treats exercise.   ROV 06/27/17 -- patient has a history of COPD, documented obstruction by spirometry. She also has allergic rhinitis. She's currently managed on . She is undergoing annual lung cancer screening CT scans. Her most recent was 01/18/17 which was RADS Cat 2. Has been feeling well. She has some occasional cough, thick white mucous. Not often. She is on Spiriva, proair - uses it about 1-2x a day. We had discussed changing to Stiolto but we never did. She feels that she is doing well on Spiriva.      Objective:   Physical Exam Vitals:   06/28/17  1630  BP: 118/72  Pulse: 80  SpO2: 97%  Weight: 158 lb (71.7 kg)  Height: 5\' 5"  (1.651 m)   Gen: Pleasant, well-nourished, in no distress,  normal affect  ENT: No lesions,  mouth clear,  oropharynx clear, no postnasal drip  Neck: No JVD, no stridor  Lungs: No use of accessory muscles, clear without rales or rhonchi  Cardiovascular: RRR, heart sounds normal, no murmur or gallops, no peripheral edema  Musculoskeletal: No deformities, no cyanosis or clubbing  Neuro: alert, non focal  Skin: Warm, no lesions or rash    01/18/2017 --  COMPARISON:  Low-dose lung cancer screening CT chest dated 08/13/2015  FINDINGS: Cardiovascular: Heart is normal in size.  No pericardial effusion.  No evidence of thoracic aortic aneurysm.  Mediastinum/Nodes: No suspicious mediastinal lymphadenopathy.  Visualized thyroid is unremarkable.  Lungs/Pleura: Mild centrilobular and paraseptal emphysematous changes, upper lobe predominant.  Small bilateral pulmonary nodules measuring up to 2.6 mm (volumetric mean), unchanged.  No focal consolidation.  Mild scarring/ atelectasis at the right lung base.  No pleural effusion or pneumothorax.  Do  Upper Abdomen: Visualized upper abdomen is unremarkable.  Musculoskeletal: Mild degenerative changes the lower thoracolumbar spine.  IMPRESSION: Lung-RADS Category 2, benign appearance or behavior. Continue annual screening with low-dose chest CT without contrast in 12 months.      Assessment & Plan:  Pulmonary nodule, right Due for LDCT screening in Spring 2019. We will order this.   COPD (chronic obstructive pulmonary disease) We will continue Spiriva once a day Take albuterol 2 puffs up to every 4 hours if needed for shortness of breath.  Flu shot today Prevnar 13 today.  Follow with Dr Lamonte Sakai in 6 months or sooner if you have any problems  Baltazar Apo, MD, PhD 06/28/2017, 5:06 PM [facility] [phone] or if no answer [phone]

## 2017-06-28 NOTE — Addendum Note (Signed)
Addended by: Jannette Spanner on: 06/28/2017 05:12 PM   Modules accepted: Orders

## 2017-07-17 ENCOUNTER — Ambulatory Visit (INDEPENDENT_AMBULATORY_CARE_PROVIDER_SITE_OTHER): Payer: 59

## 2017-07-17 ENCOUNTER — Encounter: Payer: Self-pay | Admitting: Podiatry

## 2017-07-17 ENCOUNTER — Ambulatory Visit (INDEPENDENT_AMBULATORY_CARE_PROVIDER_SITE_OTHER): Payer: 59 | Admitting: Podiatry

## 2017-07-17 DIAGNOSIS — M79671 Pain in right foot: Secondary | ICD-10-CM

## 2017-07-17 DIAGNOSIS — J449 Chronic obstructive pulmonary disease, unspecified: Secondary | ICD-10-CM | POA: Diagnosis not present

## 2017-07-17 DIAGNOSIS — M722 Plantar fascial fibromatosis: Secondary | ICD-10-CM

## 2017-07-17 DIAGNOSIS — E119 Type 2 diabetes mellitus without complications: Secondary | ICD-10-CM | POA: Diagnosis not present

## 2017-07-17 DIAGNOSIS — I10 Essential (primary) hypertension: Secondary | ICD-10-CM | POA: Diagnosis not present

## 2017-07-17 MED ORDER — TRIAMCINOLONE ACETONIDE 10 MG/ML IJ SUSP
10.0000 mg | Freq: Once | INTRAMUSCULAR | Status: AC
  Administered 2017-07-17: 10 mg

## 2017-07-17 MED ORDER — DICLOFENAC SODIUM 75 MG PO TBEC: 75.0000 mg | DELAYED_RELEASE_TABLET | Freq: Two times a day (BID) | ORAL | 2 refills | Status: DC

## 2017-07-17 NOTE — Progress Notes (Signed)
   Subjective:    Patient ID: Susan Berger, female    DOB: August 04, 1961, 56 y.o.   MRN: [id number]  HPI Chief Complaint  Patient presents with  . Foot Pain    R. foot pain; bottom heel, from a pain scale of 7 x 2 months. Pt. stated," keeps getting worse and worse each time."      Review of Systems  All other systems reviewed and are negative.      Objective:   Physical Exam        Assessment & Plan:

## 2017-07-17 NOTE — Progress Notes (Signed)
Subjective:    Patient ID: Susan Berger, female   DOB: 56 y.o.   MRN: [id number]   HPI patient states she's develop a lot of pain in the bottom of the right heel and it's just been getting worse as time goes on and making it hard to walk    ROS      Objective:  Physical Exam neurovascular status intact with significant inflammation plantar aspect right heel 6 weeks' duration with history of this problem and the left heel which is doing better at this time     Assessment:   Plantar fasciitis acute nature right with healing fasciitis left      Plan:    H&P conditions reviewed and x-rays and injected the plantar fascial right 3 mg Kenalog 5 mg Xylocaine and instructed on physical therapy discussed long-term orthotics and reappoint next couple weeks to reappoint  X-rays indicate that the digits and good alignment and there is plantar spur formation noted

## 2017-07-24 ENCOUNTER — Ambulatory Visit (INDEPENDENT_AMBULATORY_CARE_PROVIDER_SITE_OTHER): Payer: 59 | Admitting: Podiatry

## 2017-07-24 ENCOUNTER — Encounter: Payer: Self-pay | Admitting: Podiatry

## 2017-07-24 DIAGNOSIS — M722 Plantar fascial fibromatosis: Secondary | ICD-10-CM | POA: Diagnosis not present

## 2017-07-24 DIAGNOSIS — C44729 Squamous cell carcinoma of skin of left lower limb, including hip: Secondary | ICD-10-CM | POA: Diagnosis not present

## 2017-07-24 DIAGNOSIS — C44722 Squamous cell carcinoma of skin of right lower limb, including hip: Secondary | ICD-10-CM | POA: Diagnosis not present

## 2017-07-25 ENCOUNTER — Other Ambulatory Visit: Payer: Self-pay | Admitting: Podiatry

## 2017-07-25 DIAGNOSIS — M722 Plantar fascial fibromatosis: Secondary | ICD-10-CM

## 2017-07-26 NOTE — Progress Notes (Signed)
Subjective:    Patient ID: Susan Berger, female   DOB: 56 y.o.   MRN: [id number]   HPI patient states I'm doing well but I'm still having some pain in my heel    ROS      Objective:  Physical Exam neurovascular status intact with diminishment of discomfort heel but still present with a relatively narrow heel bed with a relatively high arch foot that creates stress against the heel     Assessment:    Plantar fasciitis improved but present with mechanical dysfunction     Plan:     Continue physical therapy anti-inflammatories and at this point I have recommended orthotics to try to provide for better plantar support increase cushioning and feeling in the arch secondary to foot structure. Patient will see back for evaluation and orthotic scanning

## 2017-07-31 DIAGNOSIS — E119 Type 2 diabetes mellitus without complications: Secondary | ICD-10-CM | POA: Diagnosis not present

## 2017-08-07 ENCOUNTER — Other Ambulatory Visit: Payer: 59 | Admitting: Orthotics

## 2017-08-14 ENCOUNTER — Ambulatory Visit: Payer: 59 | Admitting: Orthotics

## 2017-08-14 DIAGNOSIS — M722 Plantar fascial fibromatosis: Secondary | ICD-10-CM

## 2017-08-14 NOTE — Progress Notes (Signed)
Patient came into today for casting bilateral f/o to address plantar fasciitis.  Patient reports history of foot pain involving plantar aponeurosis.  Goal is to provide longitudinal arch support and correct any RF instability due to heel eversion/inversion.  Ultimate goal is to relieve tension at pf insertion calcaneal tuberosity.  Plan on semi-rigid device addressing heel stability and relieving PF tension.     $300 self pay

## 2017-09-04 ENCOUNTER — Ambulatory Visit (INDEPENDENT_AMBULATORY_CARE_PROVIDER_SITE_OTHER): Payer: 59 | Admitting: Orthotics

## 2017-09-04 DIAGNOSIS — M79671 Pain in right foot: Secondary | ICD-10-CM

## 2017-09-04 DIAGNOSIS — M722 Plantar fascial fibromatosis: Secondary | ICD-10-CM

## 2017-09-04 DIAGNOSIS — M79672 Pain in left foot: Secondary | ICD-10-CM

## 2017-09-04 NOTE — Progress Notes (Signed)
Patient came in today to pick up custom made foot orthotics.  The goals were accomplished and the patient reported no dissatisfaction with said orthotics.  Patient was advised of breakin period and how to report any issues.  Pay $300

## 2017-10-23 DIAGNOSIS — Z01419 Encounter for gynecological examination (general) (routine) without abnormal findings: Secondary | ICD-10-CM | POA: Diagnosis not present

## 2017-12-06 DIAGNOSIS — Z1231 Encounter for screening mammogram for malignant neoplasm of breast: Secondary | ICD-10-CM | POA: Diagnosis not present

## 2018-01-07 ENCOUNTER — Other Ambulatory Visit: Payer: Self-pay | Admitting: Emergency Medicine

## 2018-01-08 DIAGNOSIS — R82998 Other abnormal findings in urine: Secondary | ICD-10-CM | POA: Diagnosis not present

## 2018-01-08 DIAGNOSIS — Z0000 Encounter for general adult medical examination without abnormal findings: Secondary | ICD-10-CM | POA: Diagnosis not present

## 2018-01-15 DIAGNOSIS — Z0000 Encounter for general adult medical examination without abnormal findings: Secondary | ICD-10-CM | POA: Diagnosis not present

## 2018-01-15 DIAGNOSIS — I10 Essential (primary) hypertension: Secondary | ICD-10-CM | POA: Diagnosis not present

## 2018-01-15 DIAGNOSIS — E119 Type 2 diabetes mellitus without complications: Secondary | ICD-10-CM | POA: Diagnosis not present

## 2018-01-15 DIAGNOSIS — I700 Atherosclerosis of aorta: Secondary | ICD-10-CM | POA: Diagnosis not present

## 2018-01-15 DIAGNOSIS — Z1389 Encounter for screening for other disorder: Secondary | ICD-10-CM | POA: Diagnosis not present

## 2018-01-17 DIAGNOSIS — Z1212 Encounter for screening for malignant neoplasm of rectum: Secondary | ICD-10-CM | POA: Diagnosis not present

## 2018-01-22 DIAGNOSIS — Z85828 Personal history of other malignant neoplasm of skin: Secondary | ICD-10-CM | POA: Diagnosis not present

## 2018-01-22 DIAGNOSIS — L821 Other seborrheic keratosis: Secondary | ICD-10-CM | POA: Diagnosis not present

## 2018-02-06 ENCOUNTER — Other Ambulatory Visit: Payer: Self-pay | Admitting: Emergency Medicine

## 2018-02-12 ENCOUNTER — Telehealth: Payer: Self-pay | Admitting: Emergency Medicine

## 2018-02-12 DIAGNOSIS — R911 Solitary pulmonary nodule: Secondary | ICD-10-CM

## 2018-02-12 NOTE — Telephone Encounter (Signed)
Called and spoke to pt. Pt was asking about her follow up CT chest. Per pt's chart pt was to have a low dose CT chest in Spring of 2019, no order was placed. Order placed for CT chest. Pt has an ROV with RB on 6.24.19.   PCC's please advise on when pt can have CT chest scheduled. Thanks.

## 2018-02-12 NOTE — Telephone Encounter (Signed)
I have tried to call the patient and had to leave a message for her to call me back to schedule the CT

## 2018-02-13 NOTE — Telephone Encounter (Signed)
Sorry I forgot to make a note the patient has been schedule at [place] on 02/19/2018 @ 10:30am and she is aware of this appt and the [place]. Location

## 2018-02-13 NOTE — Telephone Encounter (Signed)
Susan Berger are you going to try to call pt again today to see if you can schedule CT?

## 2018-02-19 ENCOUNTER — Ambulatory Visit
Admission: RE | Admit: 2018-02-19 | Discharge: 2018-02-19 | Disposition: A | Discharge status: Home / Self Care | Payer: 59 | Source: Ambulatory Visit | Attending: Emergency Medicine | Admitting: Emergency Medicine

## 2018-02-19 DIAGNOSIS — R911 Solitary pulmonary nodule: Secondary | ICD-10-CM

## 2018-02-19 DIAGNOSIS — Z87891 Personal history of nicotine dependence: Secondary | ICD-10-CM | POA: Diagnosis not present

## 2018-03-06 ENCOUNTER — Other Ambulatory Visit: Payer: Self-pay | Admitting: Emergency Medicine

## 2018-03-28 ENCOUNTER — Other Ambulatory Visit: Payer: Self-pay | Admitting: Emergency Medicine

## 2018-04-09 ENCOUNTER — Ambulatory Visit (INDEPENDENT_AMBULATORY_CARE_PROVIDER_SITE_OTHER): Payer: 59 | Admitting: Emergency Medicine

## 2018-04-09 ENCOUNTER — Encounter: Payer: Self-pay | Admitting: Emergency Medicine

## 2018-04-09 DIAGNOSIS — J301 Allergic rhinitis due to pollen: Secondary | ICD-10-CM | POA: Diagnosis not present

## 2018-04-09 DIAGNOSIS — R911 Solitary pulmonary nodule: Secondary | ICD-10-CM

## 2018-04-09 DIAGNOSIS — J449 Chronic obstructive pulmonary disease, unspecified: Secondary | ICD-10-CM

## 2018-04-09 NOTE — Progress Notes (Signed)
Subjective:    Patient ID: Susan Berger, female    DOB: 1961/08/14, 57 y.o.   MRN: [id number]  HPI 57 yo woman, former tobacco (33 pk-yrs), had CT scan of abdomen for renal stones in 7/12, found a 2.56mm RLL nodule. Referred for the nodule. She tells me that she has had cough, clear sputum. Also some exertional SOB.   ROV 10/03/11 -- f/u visit, tobacco hx and nodule by CT scan 7/12. She is doing well, no breathing complaints. 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 CT scan done 11/20 = 8mm R apical nodule.   ROV 10/23/12 -- here to f/u tobacco use, mild AFL, R apical nodule on CT scan 08/2011. Repeat scan 09/24/12 >> no change in the R apical nodule. She uses albuterol sometimes to pretreat exercise.  No exacerbations  ROV 10/23/13 -- follows up for mild AFL, R apical nodule seen on CT scan 08/2011. Repeat CT scan 09/2013 > nodule is stable for 2 yrs. She has stable exertional sob. Uses albuterol 2-3x a week.   ROV 05/20/14 --  follows up for mild AFL, R apical nodule seen on CT scan 08/2011 that was stable for 2+ years.  She tried spiriva but it didn't help her so she stopped. She is using albuterol prn, not every day. It helps with clearing secretions. She would qualify for LDCT screening.    ROV 07/07/15 -- Follow-up visit for mild obstructive lung disease, stable right apical nodule originally identified in 2012. At our last visit we stopped Spiriva, continued albuterol as needed.  Not smoking. Able to be active. Breathing does not limit her unless she walks a long distance. She does have cough, every day especially in the am prod clear. She has a lot of nasal congestion and sinus fullness.   She is undergone low-dose CT scan screening in September 2015 with a RADS-2 scan.   ROV 03/03/16 -- patient has a history of mild obstructive lung disease and abnormal CT scan of the chest as detailed above. She has been participating in the lung cancer screening program and had a RADS-2 scan in October  2016. She is due for a repeat scan in October of this year. At her last visit we restarted Spiriva to see if she would benefit. She believes that it has helped her - able to exert better. She is having some wheeze at night. She coughs in the am, has a lot of nasal drainage. She uses albuterol a lot of mornings.    ROV 09/05/16 -- the patient follows up today for mild COPD and hx tobacco use, prior apical R nodule. She underwent LDCT on 08/13/15 that I have reviewed. This was a RADS 2 scan, no new alarming findings. She is due for her repeat scan now.  She reports that she was just treated for an AE by her PCP with abx, no prednisone. Finished last week. 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 She has been managed on Spiriva. She ran out of spiriva about 2 months ago and feels that she misses. Minimal exertional sx. No current wheeze or cough.   ROV 02/20/17 -- this is a follow-up visit for history of COPD and former tobacco use. Spirometry with obstruction as above. She is having a bit more PND, sneezing, some cough. She is on loratadine and flonase. She has been  taking Spiriva reliably - feels that her breathing is better. No flares since last time. Exercise tolerance is good. Uses albuterol about every other day, pre-treats exercise.   ROV 06/27/17 -- patient has a history of COPD, documented obstruction by spirometry. She also has allergic rhinitis. She's currently managed on . She is undergoing annual lung cancer screening CT scans. Her most recent was 01/18/17 which was RADS Cat 2. Has been feeling well. She has some occasional cough, thick white mucous. Not often. She is on Spiriva, proair - uses it about 1-2x a day. We had discussed changing to Stiolto but we never did. She feels that she is doing well on Spiriva.  ROV 04/09/18 --this is a follow-up visit for 57 year old  woman with a history of COPD, allergic rhinitis. She is doing well on Stiolto, feels that it helps her activity level. She did have some cough during the Spring allergy season. She is on flonase, helps her. She uses her albuterol most mornings. No flares, no pred or abx since last time. PNA shots up to date.         Objective:   Physical Exam Vitals:   04/09/18 0908  BP: 128/76  Pulse: 71  SpO2: 97%  Weight: 149 lb (67.6 kg)  Height: 5\' 5"  (1.651 m)   Gen: Pleasant, well-nourished, in no distress,  normal affect  ENT: No lesions,  mouth clear,  oropharynx clear, no postnasal drip  Neck: No JVD, no stridor  Lungs: No use of accessory muscles, clear without rales or rhonchi  Cardiovascular: RRR, heart sounds normal, no murmur or gallops, no peripheral edema  Musculoskeletal: No deformities, no cyanosis or clubbing  Neuro: alert, non focal  Skin: Warm, no lesions or rash     CT chest 02/19/18 --  COMPARISON:  Low dose lung cancer screening CT chest dated 01/18/2017  FINDINGS: Cardiovascular: Heart is normal in size.  No pericardial effusion.  No evidence of thoracic aortic aneurysm.  Mediastinum/Nodes: No suspicious mediastinal lymphadenopathy.  Visualized thyroid is unremarkable.  Lungs/Pleura: Mild centrilobular and paraseptal emphysematous changes.  No focal consolidation.  Small bilateral pulmonary nodules measuring up to 1.9 mm.  No pleural effusion or pneumothorax.  Upper Abdomen: Visualized upper abdomen is grossly unremarkable.  Musculoskeletal: Visualized osseous structures are within normal limits.  IMPRESSION: Lung-RADS 2, benign appearance or behavior. Continue annual screening with low-dose chest CT without contrast in 12 months.  Emphysema (ICD10-J43.9).      Assessment & Plan:  Pulmonary nodule, right Following her with low-dose CT scan screening.  Stable imaging on her last scan.  Plan for repeat in 1 year.  Allergic  rhinitis Continue Flonase daily  COPD (chronic obstructive pulmonary disease) Stable on Stiolto.  She is benefiting.  She uses albuterol about once a day.  Usually takes it in the morning prior to the [facility].  No exacerbations reported.  Pneumovax, Prevnar up-to-date.  Please continue Stiolto once daily as you have been taking it. Keep your albuterol available to use 2 puffs when needed for shortness of breath, wheezing, chest tightness. Your pneumonia shots are up-to-date. Remember to get the flu shot this fall. Follow with Dr Lamonte Sakai in 12 months or sooner if you have any problems  Baltazar Apo, MD, PhD 04/09/2018, 9:33 AM [facility] [phone] or if no answer [phone]

## 2018-04-09 NOTE — Assessment & Plan Note (Signed)
Stable on Stiolto.  She is benefiting.  She uses albuterol about once a day.  Usually takes it in the morning prior to the [facility].  No exacerbations reported.  Pneumovax, Prevnar up-to-date.  Please continue Stiolto once daily as you have been taking it. Keep your albuterol available to use 2 puffs when needed for shortness of breath, wheezing, chest tightness. Your pneumonia shots are up-to-date. Remember to get the flu shot this fall. Follow with Dr Lamonte Sakai in 12 months or sooner if you have any problems

## 2018-04-09 NOTE — Assessment & Plan Note (Signed)
Continue Flonase daily 

## 2018-04-09 NOTE — Assessment & Plan Note (Signed)
Following her with low-dose CT scan screening.  Stable imaging on her last scan.  Plan for repeat in 1 year.

## 2018-04-09 NOTE — Patient Instructions (Signed)
Your CT scan of the chest is stable without any worrisome changes.  You need a repeat scan in 1 year. Please continue Stiolto once daily as you have been taking it. Keep your albuterol available to use 2 puffs when needed for shortness of breath, wheezing, chest tightness. Your pneumonia shots are up-to-date. Remember to get the flu shot this fall. Continue Flonase nasal spray, 2 sprays each nostril once daily. Follow with Dr Lamonte Sakai in 12 months or sooner if you have any problems

## 2018-04-24 ENCOUNTER — Other Ambulatory Visit: Payer: Self-pay | Admitting: Emergency Medicine

## 2018-05-01 ENCOUNTER — Other Ambulatory Visit: Payer: Self-pay | Admitting: Emergency Medicine

## 2018-06-15 DIAGNOSIS — N200 Calculus of kidney: Secondary | ICD-10-CM | POA: Diagnosis not present

## 2018-06-15 DIAGNOSIS — R1084 Generalized abdominal pain: Secondary | ICD-10-CM | POA: Diagnosis not present

## 2018-06-24 IMAGING — CT CT CHEST LUNG CANCER SCREENING LOW DOSE W/O CM
2 of 5 series · 15 of 40 positions shown, 18 images · non-contrast
Comparison: Low dose lung cancer screening CT chest dated 01/18/2017

CLINICAL DATA: 57-year-old female former smoker, quit 7 years ago,
with 33 pack-year history of smoking, for follow-up lung cancer
screening

EXAM:
CT CHEST WITHOUT CONTRAST LOW-DOSE FOR LUNG CANCER SCREENING
TECHNIQUE: Multidetector CT imaging of the chest was performed following the
standard protocol without IV contrast.

[Series 4: lung 1.00 br60 · axial · 0.66mm/px · z∈[-1127,-848]mm · 12 of 309 slices shown, 15 images]
[im 15/309  mediastinal]
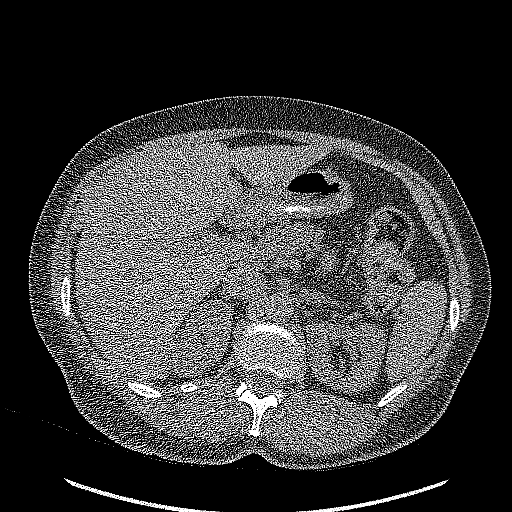
[im 15/309  lung]
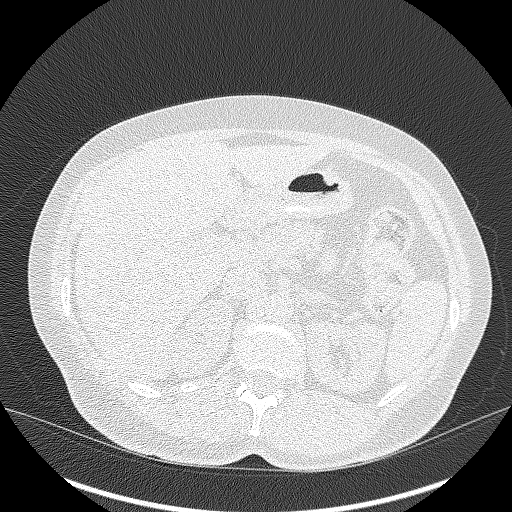
[im 45/309  lung]
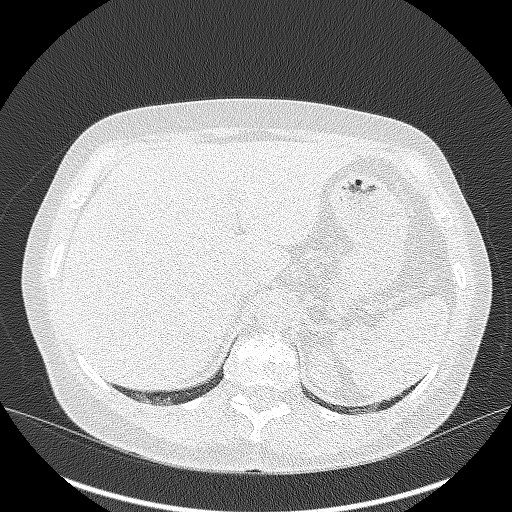
[im 74/309  lung]
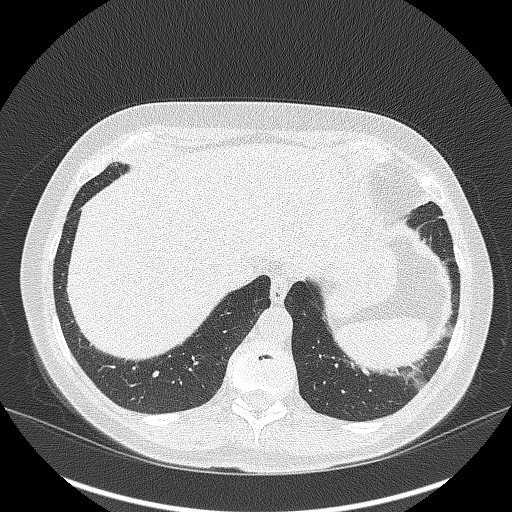
[im 89/309  lung]
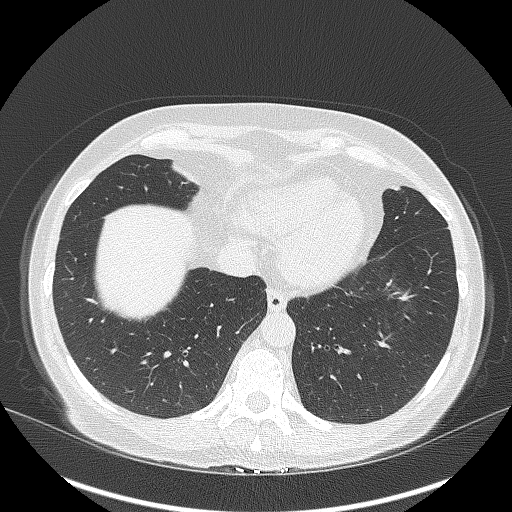
[im 118/309  mediastinal]
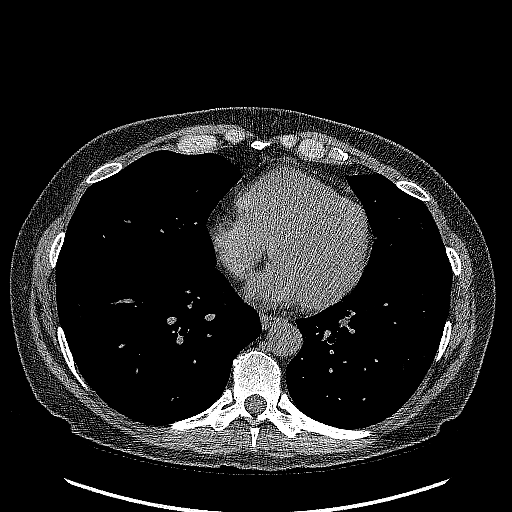
[im 118/309  lung]
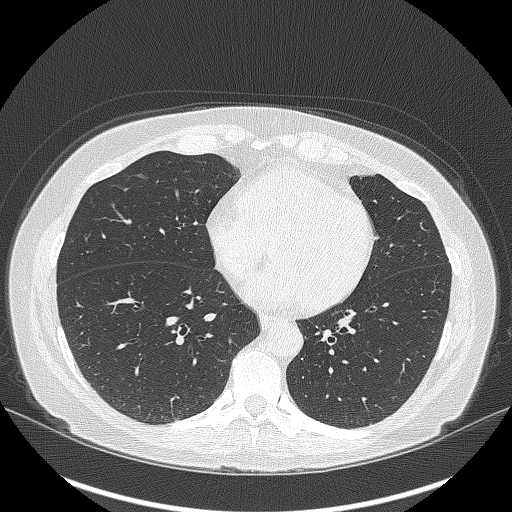
[im 147/309  lung]
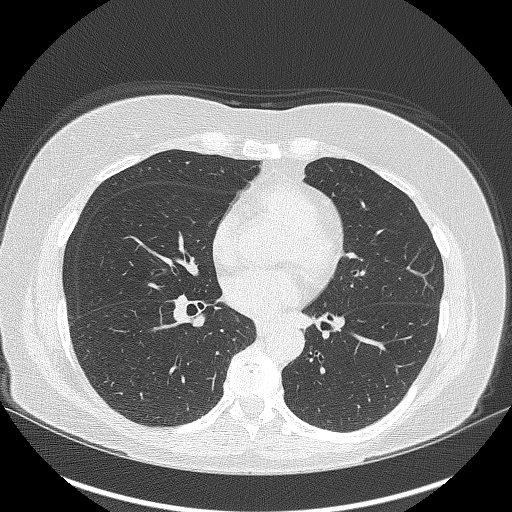
[im 162/309  lung]
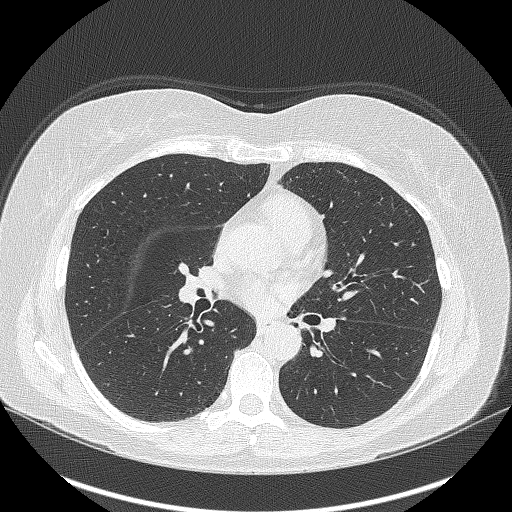
[im 191/309  lung]
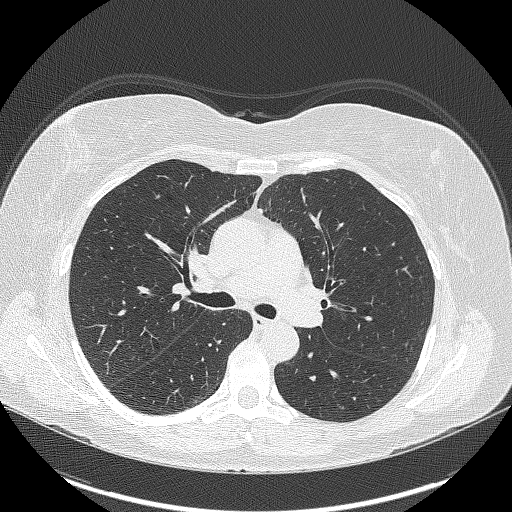
[im 221/309  mediastinal]
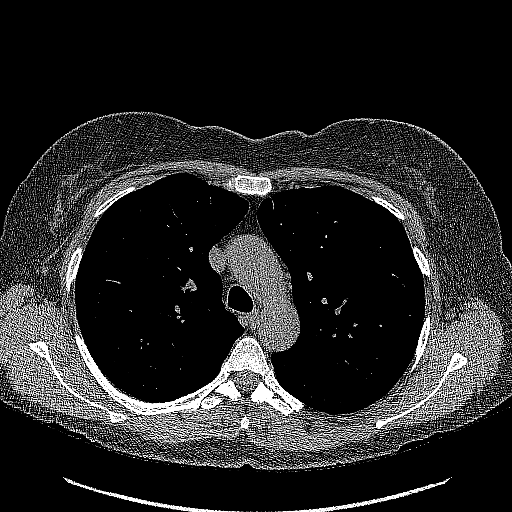
[im 221/309  lung]
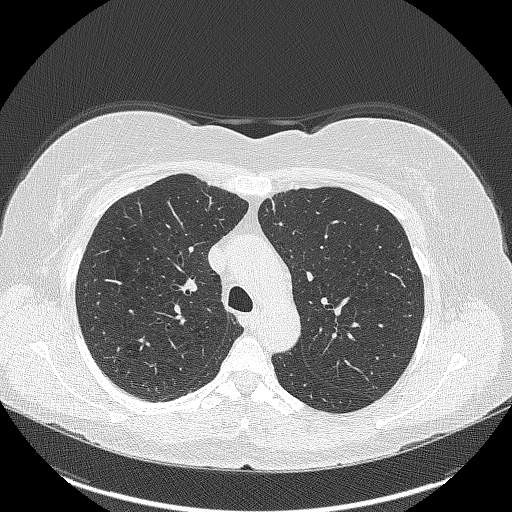
[im 235/309  lung]
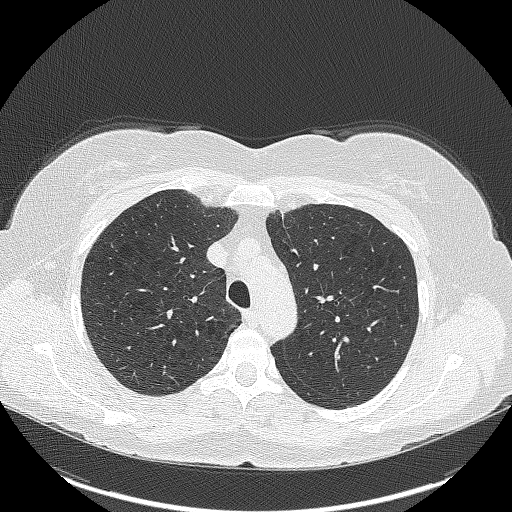
[im 265/309  lung]
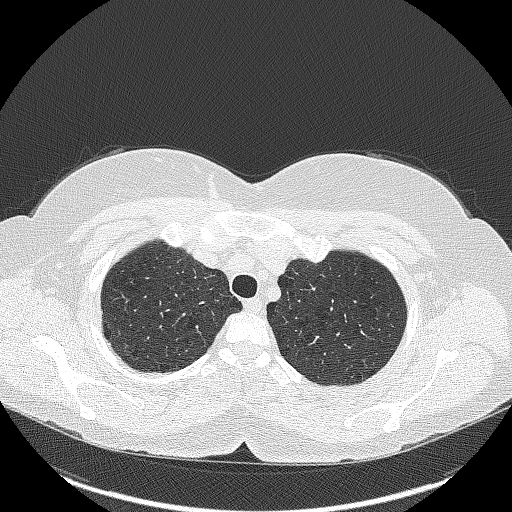
[im 294/309  lung]
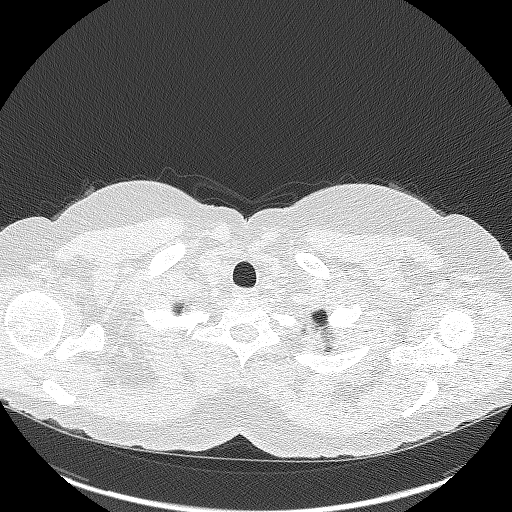

[Series 5: lung 1.00 br44 cor · coronal · 0.61mm/px · 3 of 352 slices shown]
[im 71/352  lung]
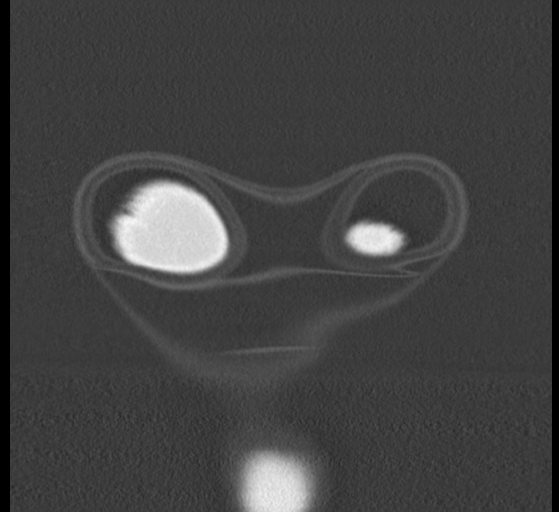
[im 141/352  lung]
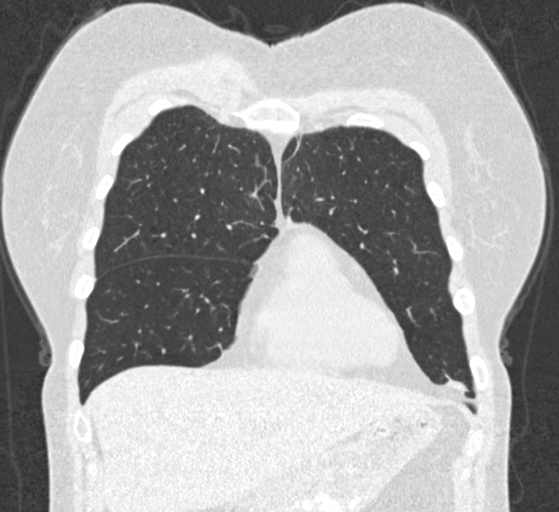
[im 211/352  lung]
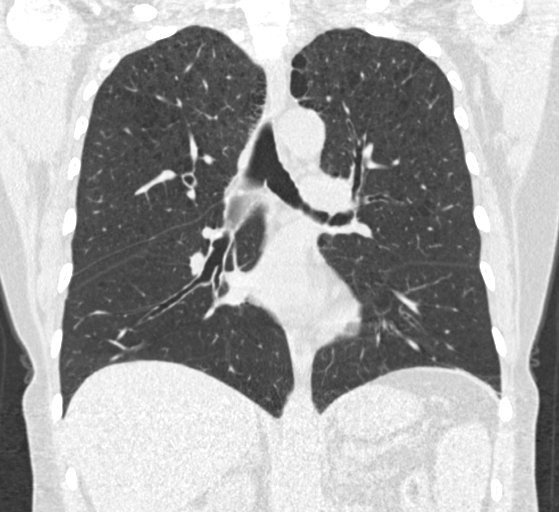

[15 of 40 positions shown; findings below may reference images not displayed]

FINDINGS: Cardiovascular: Heart is normal in size.  No pericardial effusion.

No evidence of thoracic aortic aneurysm.

Mediastinum/Nodes: No suspicious mediastinal lymphadenopathy.

Visualized thyroid is unremarkable.

Lungs/Pleura: Mild centrilobular and paraseptal emphysematous
changes.

No focal consolidation.

Small bilateral pulmonary nodules measuring up to 1.9 mm.

No pleural effusion or pneumothorax.

Upper Abdomen: Visualized upper abdomen is grossly unremarkable.

Musculoskeletal: Visualized osseous structures are within normal
limits.
IMPRESSION: Lung-RADS 2, benign appearance or behavior. Continue annual
screening with low-dose chest CT without contrast in 12 months.

Emphysema ([id number]).

## 2018-06-25 DIAGNOSIS — R1084 Generalized abdominal pain: Secondary | ICD-10-CM | POA: Diagnosis not present

## 2018-06-25 DIAGNOSIS — N200 Calculus of kidney: Secondary | ICD-10-CM | POA: Diagnosis not present

## 2018-06-25 DIAGNOSIS — R310 Gross hematuria: Secondary | ICD-10-CM | POA: Diagnosis not present

## 2018-07-02 DIAGNOSIS — R310 Gross hematuria: Secondary | ICD-10-CM | POA: Diagnosis not present

## 2018-07-02 DIAGNOSIS — N202 Calculus of kidney with calculus of ureter: Secondary | ICD-10-CM | POA: Diagnosis not present

## 2018-07-16 DIAGNOSIS — E119 Type 2 diabetes mellitus without complications: Secondary | ICD-10-CM | POA: Diagnosis not present

## 2018-07-16 DIAGNOSIS — N200 Calculus of kidney: Secondary | ICD-10-CM | POA: Diagnosis not present

## 2018-07-16 DIAGNOSIS — I700 Atherosclerosis of aorta: Secondary | ICD-10-CM | POA: Diagnosis not present

## 2018-07-17 DIAGNOSIS — R310 Gross hematuria: Secondary | ICD-10-CM | POA: Diagnosis not present

## 2018-07-17 DIAGNOSIS — N3946 Mixed incontinence: Secondary | ICD-10-CM | POA: Diagnosis not present

## 2018-07-17 DIAGNOSIS — N202 Calculus of kidney with calculus of ureter: Secondary | ICD-10-CM | POA: Diagnosis not present

## 2018-09-11 ENCOUNTER — Other Ambulatory Visit: Payer: Self-pay | Admitting: *Deleted

## 2018-09-11 MED ORDER — ALBUTEROL SULFATE HFA 108 (90 BASE) MCG/ACT IN AERS: INHALATION_SPRAY | RESPIRATORY_TRACT | 5 refills | Status: DC

## 2018-09-24 DIAGNOSIS — E119 Type 2 diabetes mellitus without complications: Secondary | ICD-10-CM | POA: Diagnosis not present

## 2018-11-12 ENCOUNTER — Other Ambulatory Visit: Payer: Self-pay | Admitting: Emergency Medicine

## 2018-12-10 DIAGNOSIS — Z01419 Encounter for gynecological examination (general) (routine) without abnormal findings: Secondary | ICD-10-CM | POA: Diagnosis not present

## 2018-12-10 DIAGNOSIS — Z6825 Body mass index (BMI) 25.0-25.9, adult: Secondary | ICD-10-CM | POA: Diagnosis not present

## 2019-01-21 DIAGNOSIS — Z0000 Encounter for general adult medical examination without abnormal findings: Secondary | ICD-10-CM | POA: Diagnosis not present

## 2019-01-23 DIAGNOSIS — E119 Type 2 diabetes mellitus without complications: Secondary | ICD-10-CM | POA: Diagnosis not present

## 2019-01-23 DIAGNOSIS — R82998 Other abnormal findings in urine: Secondary | ICD-10-CM | POA: Diagnosis not present

## 2019-01-28 DIAGNOSIS — Z87891 Personal history of nicotine dependence: Secondary | ICD-10-CM | POA: Diagnosis not present

## 2019-01-28 DIAGNOSIS — I700 Atherosclerosis of aorta: Secondary | ICD-10-CM | POA: Diagnosis not present

## 2019-01-28 DIAGNOSIS — J439 Emphysema, unspecified: Secondary | ICD-10-CM | POA: Diagnosis not present

## 2019-01-28 DIAGNOSIS — Z0000 Encounter for general adult medical examination without abnormal findings: Secondary | ICD-10-CM | POA: Diagnosis not present

## 2019-04-09 ENCOUNTER — Other Ambulatory Visit: Payer: Self-pay

## 2019-04-09 ENCOUNTER — Encounter: Payer: Self-pay | Admitting: Emergency Medicine

## 2019-04-09 ENCOUNTER — Ambulatory Visit (INDEPENDENT_AMBULATORY_CARE_PROVIDER_SITE_OTHER): Payer: 59 | Admitting: Emergency Medicine

## 2019-04-09 DIAGNOSIS — J449 Chronic obstructive pulmonary disease, unspecified: Secondary | ICD-10-CM

## 2019-04-09 NOTE — Assessment & Plan Note (Signed)
Please continue Stiolto as you have been taking it. Keep your albuterol available to use 2 puffs if needed for shortness breath, chest tightness, mucus clearance.  We will work on trying to convert your albuterol prescription back to pro-air since this gave you the most benefit. Continue to stay active, but practice social distancing, handwashing, masking. Get the flu shot in the fall Let us know when you want to reschedule your low-dose CT scan of the chest for lung cancer screening. Follow with Dr. Lamonte Sakai in 12 months or sooner if you have any problems.

## 2019-04-09 NOTE — Progress Notes (Signed)
  Subjective:    Patient ID: Susan Berger, female    DOB: 1961/04/30, 58 y.o.   MRN: [id number]  HPI  ROV 04/09/18 --this is a follow-up visit for 58 year old woman with a history of COPD, allergic rhinitis. She is doing well on Stiolto, feels that it helps her activity level. She did have some cough during the Spring allergy season. She is on flonase, helps her. She uses her albuterol most mornings. No flares, no pred or abx since last time. PNA shots up to date.   ROV 04/09/2019 --58 year old woman with a history of COPD (moderate to severe obstruction without a bronchodilator response by PFT).  We have been managing her on Stiolto.  She has albuterol that she has been using 2x a day. Helps her get mucous up, thick clear. She is active, is working, can garden.  Most recent LD-CT 02/19/2018, RADS 2, reassuring. No exacerbations reported. Likes stiolto. Feels that she did better w ProAir - wants to get it if possible. She wants to put off LD-CT because she doesn't want to have to get COVID test      Objective:   Physical Exam Vitals:   04/09/19 0950  BP: 118/82  Pulse: 83  Temp: 98.1 F (36.7 C)  TempSrc: Oral  SpO2: 97%  Weight: 149 lb 6.4 oz (67.8 kg)  Height: 5\' 5"  (1.651 m)   Gen: Pleasant, well-nourished, in no distress,  normal affect  ENT: No lesions,  mouth clear,  oropharynx clear, no postnasal drip  Neck: No JVD, no stridor  Lungs: No use of accessory muscles, clear without rales or rhonchi  Cardiovascular: RRR, heart sounds normal, no murmur or gallops, no peripheral edema  Musculoskeletal: No deformities, no cyanosis or clubbing  Neuro: alert, non focal  Skin: Warm, no lesions or rash     CT chest 02/19/18 --  COMPARISON:  Low dose lung cancer screening CT chest dated 01/18/2017  FINDINGS: Cardiovascular: Heart is normal in size.  No pericardial effusion.  No evidence of thoracic aortic aneurysm.  Mediastinum/Nodes: No suspicious mediastinal  lymphadenopathy.  Visualized thyroid is unremarkable.  Lungs/Pleura: Mild centrilobular and paraseptal emphysematous changes.  No focal consolidation.  Small bilateral pulmonary nodules measuring up to 1.9 mm.  No pleural effusion or pneumothorax.  Upper Abdomen: Visualized upper abdomen is grossly unremarkable.  Musculoskeletal: Visualized osseous structures are within normal limits.  IMPRESSION: Lung-RADS 2, benign appearance or behavior. Continue annual screening with low-dose chest CT without contrast in 12 months.  Emphysema (ICD10-J43.9).      Assessment & Plan:  COPD (chronic obstructive pulmonary disease) Please continue Stiolto as you have been taking it. Keep your albuterol available to use 2 puffs if needed for shortness breath, chest tightness, mucus clearance.  We will work on trying to convert your albuterol prescription back to pro-air since this gave you the most benefit. Continue to stay active, but practice social distancing, handwashing, masking. Get the flu shot in the fall Let us know when you want to reschedule your low-dose CT scan of the chest for lung cancer screening. Follow with Dr. Lamonte Sakai in 12 months or sooner if you have any problems.  Baltazar Apo, MD, PhD 04/09/2019, 10:13 AM  [facility] [phone] or if no answer [phone]

## 2019-04-09 NOTE — Patient Instructions (Addendum)
Please continue Stiolto as you have been taking it. Keep your albuterol available to use 2 puffs if needed for shortness breath, chest tightness, mucus clearance.  We will work on trying to convert your albuterol prescription back to pro-air since this gave you the most benefit. Continue to stay active, but practice social distancing, handwashing, masking. Get the flu shot in the fall Let us know when you want to reschedule your low-dose CT scan of the chest for lung cancer screening. Follow with Dr. Lamonte Sakai in 12 months or sooner if you have any problems.

## 2019-05-15 ENCOUNTER — Other Ambulatory Visit: Payer: Self-pay | Admitting: Emergency Medicine

## 2019-07-12 ENCOUNTER — Telehealth: Payer: Self-pay | Admitting: Emergency Medicine

## 2019-07-12 MED ORDER — ALBUTEROL SULFATE HFA 108 (90 BASE) MCG/ACT IN AERS: INHALATION_SPRAY | RESPIRATORY_TRACT | 5 refills | Status: DC

## 2019-07-12 NOTE — Telephone Encounter (Signed)
Called patient and verified rx refill request/pharmacy Proair refilled Nothing further needed.

## 2019-11-22 ENCOUNTER — Other Ambulatory Visit: Payer: Self-pay | Admitting: Emergency Medicine

## 2020-02-04 NOTE — Telephone Encounter (Signed)
Susan Berger. I do not remember this patient. If she is 48 she does meet age criteria. Can you call me on  My cell? [phone]

## 2020-02-04 NOTE — Telephone Encounter (Signed)
Spoke with Eric Form, NP. Nothing needed at this time. Will sign off.

## 2020-02-17 ENCOUNTER — Ambulatory Visit (INDEPENDENT_AMBULATORY_CARE_PROVIDER_SITE_OTHER): Payer: 59 | Admitting: Primary Care

## 2020-02-17 ENCOUNTER — Encounter: Payer: Self-pay | Admitting: Primary Care

## 2020-02-17 ENCOUNTER — Telehealth: Payer: Self-pay | Admitting: Primary Care

## 2020-02-17 ENCOUNTER — Ambulatory Visit (INDEPENDENT_AMBULATORY_CARE_PROVIDER_SITE_OTHER): Payer: 59

## 2020-02-17 ENCOUNTER — Other Ambulatory Visit: Payer: Self-pay

## 2020-02-17 VITALS — BP 120/70 | HR 81 | Temp 97.6°F | Ht 65.0 in | Wt 144.6 lb

## 2020-02-17 DIAGNOSIS — Z01811 Encounter for preprocedural respiratory examination: Secondary | ICD-10-CM | POA: Insufficient documentation

## 2020-02-17 DIAGNOSIS — J439 Emphysema, unspecified: Secondary | ICD-10-CM

## 2020-02-17 DIAGNOSIS — R911 Solitary pulmonary nodule: Secondary | ICD-10-CM | POA: Diagnosis not present

## 2020-02-17 MED ORDER — ALBUTEROL SULFATE HFA 108 (90 BASE) MCG/ACT IN AERS: INHALATION_SPRAY | RESPIRATORY_TRACT | 3 refills | Status: DC

## 2020-02-17 MED ORDER — STIOLTO RESPIMAT 2.5-2.5 MCG/ACT IN AERS: INHALATION_SPRAY | RESPIRATORY_TRACT | 5 refills | Status: DC

## 2020-02-17 NOTE — Assessment & Plan Note (Signed)
-   LDCT 02/19/18 showed Lung-RADS 2  - Will get in touch with LDCT nurse coordinator to re-establish

## 2020-02-17 NOTE — Telephone Encounter (Signed)
Patient overdue for LDCT, last done in 2019 which showed lung RADS 2.

## 2020-02-17 NOTE — Progress Notes (Signed)
@Patient  ID: Susan Berger, female    DOB: 10-18-1960, 59 y.o.   MRN: [id number]  Chief Complaint  Patient presents with  . Surgical Clearance    Referring provider: Haywood Pao, MD  HPI: 59 year old female, former smoker quit 2012 (33 pack year hx). PMH significant COPD, allergic rhinitis, pulmonary nodule. Patient of Dr. Lamonte Sakai, last seen on 04/09/19. Maintained on Stiolto, prn albuterol. Follow-up annually.   02/17/2020 Patient presents today for surgical clearance. She is having posterior spinal fusion and left lateral interbody fusion of lumbar 2-3 and lumbar 3-4 with Dr. Royston Sinner May 27th. She will be admitted to [facility] for one night observation.   Since last visit she has been doing well. No issues with breathing or recent COPD exacerbations. Continues using Stiolto. Uses her Ventolin in hte morning and 1-2 times a week during the day during allegy season. No shortness of breath. Occasional morning congestion. No dyspnea on exertion, chest tightness of wheezing. She is not on oxygen. She has had her first pfizer covid vaccine.   Significant testing:  PFTs  09/05/2016- FVC 3.00 (78%), FEV1 1.93 (64%), ratio 64  Imaging: LDCT 02/19/18- Lung-RADS 2, benign appearance or behavior. Continue annual screening with low-dose chest CT without contrast in 12 months.  No Known Allergies  Immunization History  Administered Date(s) Administered  . Influenza Split 07/23/2012, 07/17/2013  . Influenza,inj,Quad PF,6+ Mos 07/07/2015, 08/17/2016, 06/28/2017  . Influenza-Unspecified 07/09/2018  . Pneumococcal Conjugate-13 06/28/2017  . Pneumococcal Polysaccharide-23 09/05/2016    Past Medical History:  Diagnosis Date  . Allergy   . Anxiety   . Borderline diabetes   . Constipation    uses magnesium daily and with this stools are sofe  . COPD (chronic obstructive pulmonary disease) ([place])   . Depression   . Diabetes mellitus   . Hyperlipidemia   . Migraine   .  Multiple allergies    allergy vac 3 days a week    Tobacco History: Social History   Tobacco Use  Smoking Status Former Smoker  . Packs/day: 1.00  . Years: 33.00  . Pack years: 33.00  . Types: Cigarettes  . Quit date: 04/01/2011  . Years since quitting: 8.8  Smokeless Tobacco Never Used   Counseling given: Not Answered   Outpatient Medications Prior to Visit  Medication Sig Dispense Refill  . atorvastatin (LIPITOR) 80 MG tablet Take 80 mg by mouth daily.    . CYMBALTA 60 MG capsule Take 1 tablet by mouth daily.    Marland Kitchen loratadine (CLARITIN) 10 MG tablet Take 10 mg by mouth daily.    . metFORMIN (GLUCOPHAGE) 500 MG tablet Take 1 tablet by mouth 2 (two) times daily with a meal.     . albuterol (PROAIR HFA) 108 (90 Base) MCG/ACT inhaler INHALE 2 PUFFS INTO THE LUNGS EVERY 6 HOURS AS NEEDED FOR WHEEZING. 18 g 5  . fluticasone (FLONASE) 50 MCG/ACT nasal spray USE 2 SPRAYS INTO EACH NOSTRIL EVERY DAY 16 g 5  . STIOLTO RESPIMAT 2.5-2.5 MCG/ACT AERS INHALE 2 PUFFS BY MOUTH INTO THE LUNGS DAILY 4 g 5   Facility-Administered Medications Prior to Visit  Medication Dose Route Frequency Provider Last Rate Last Admin  . triamcinolone acetonide (KENALOG) 10 MG/ML injection 10 mg  10 mg Other Once Wallene Huh, DPM        Review of Systems  Review of Systems  Respiratory: Negative for cough, chest tightness, shortness of breath and wheezing.   Cardiovascular: Negative.  Physical Exam  BP 120/70 (BP Location: Right Arm, Cuff Size: Normal)   Pulse 81   Temp 97.6 F (36.4 C) (Temporal)   Ht 5\' 5"  (1.651 m)   Wt 144 lb 9.6 oz (65.6 kg)   SpO2 97%   BMI 24.06 kg/m  Physical Exam Constitutional:      Appearance: Normal appearance.  HENT:     Head: Normocephalic and atraumatic.  Pulmonary:     Effort: Pulmonary effort is normal.     Breath sounds: Normal breath sounds. No wheezing or rhonchi.  Neurological:     General: No focal deficit present.     Mental Status: She is alert  and oriented to person, place, and time. Mental status is at baseline.  Psychiatric:        Mood and Affect: Mood normal.        Behavior: Behavior normal.        Thought Content: Thought content normal.        Judgment: Judgment normal.      Lab Results:  CBC    Component Value Date/Time   WBC 10.4 04/16/2009 0300   RBC 3.82 (L) 04/16/2009 0300   HGB 12.2 04/16/2009 0309   HCT 36.0 04/16/2009 0309   PLT 224 04/16/2009 0300   MCV 94.8 04/16/2009 0300   MCHC 34.4 04/16/2009 0300   RDW 13.7 04/16/2009 0300   LYMPHSABS 0.5 (L) 04/16/2009 0300   MONOABS 0.3 04/16/2009 0300   EOSABS 0.0 04/16/2009 0300   BASOSABS 0.0 04/16/2009 0300    BMET    Component Value Date/Time   NA 138 04/16/2009 0309   K 3.6 04/16/2009 0309   CL 110 04/16/2009 0309   GLUCOSE 133 (H) 04/16/2009 0309   BUN 25 (H) 04/16/2009 0309   CREATININE 1.0 04/16/2009 0309    BNP No results found for: BNP  ProBNP No results found for: PROBNP  Imaging: No results found.   Assessment & Plan:   Pre-operative respiratory examination Cleared by pulmonary for spinal surgery with Dr. Lynann Bologna on May 27th. She is considered an intermediate risk for post-op pulmonary complications d/t nature of surgery and hx of COPD.   Recommend 1. Short duration of surgery as much as possible and avoid paralytic if possible 2. Recovery in step down or ICU with Pulmonary consultation if needed  3. DVT prophylaxis 4. Aggressive pulmonary toilet with o2, bronchodilatation, and incentive spirometry and early ambulation  COPD (chronic obstructive pulmonary disease) - Stable on current treatment - No recent COPD exacerbations or bronchitis in the last several months  - Lungs clear on exam, O2 saturation 97% on room air  - Continue Stiolto two puffs once daily; PRN Albuterol hfa 2 puffs every 4-6 hours for shortness of breath or wheezing  - Checking CXR for pre-op clearance   Pulmonary nodule, right - LDCT 02/19/18 showed  Lung-RADS 2  - Will get in touch with LDCT nurse coordinator to re-establish     1) RISK FOR PROLONGED MECHANICAL VENTILAION - > 48h  1A) Arozullah - Prolonged mech ventilation risk Arozullah Postperative Pulmonary Risk Score - for mech ventilation dependence >48h [employer], Ann Surg 2000, major non-cardiac surgery) Comment Score  Type of surgery - abd ao aneurysm (27), thoracic (21), neurosurgery / upper abdominal / vascular (21), neck (11) Spinal  21  Emergency Surgery - (11)  0  ALbumin < 3 or poor nutritional state - (9)  0  BUN > 30 -  (8)  0  Partial or completely dependent functional status - (7)  0  COPD -  (6)  6  Age - 60 to 15 (4), > 70  (6)  0  TOTAL  27  Risk Stratifcation scores  - < 10 (0.5%), 11-19 (1.8%), 20-27 (4.2%), 28-40 (10.1%), >40 (26.6%)  4.2% risk       1B) GUPTA - Prolonged Mech Vent Risk Score source Risk  Guptal post op prolonged mech ventilation > 48h or reintubation < 30 days - ACS 2007-2008 dataset - http://lewis-perez.info/ 0.4 % Risk of mechanical ventilation for >48 hrs after surgery, or unplanned intubation ?30 days of surgery    2) RISK FOR POST OP PNEUMONIA Score source Risk  Lyndel Safe - Post Op Pnemounia risk  [email or web address] 0.4 % Risk of postoperative pneumonia    R3) ISK FOR ANY POST-OP PULMONARY COMPLICATION Score source Risk  CANET/ARISCAT Score - risk for ANY/ALl pulmonary complications - > risk of in-hospital post-op pulmonary complications (composite including respiratory failure, respiratory infection, pleural effusion, atelectasis, pneumothorax, bronchospasm, aspiration pneumonitis) [employer] - based on age, anemia, pulse ox, resp infection prior 30d, incision site, duration of surgery, and emergency v elective surgery Intermediate risk 13.3% risk of in-hospital post-op pulmonary  complications (composite including respiratory failure, respiratory infection, pleural effusion, atelectasis, pneumothorax, bronchospasm, aspiration pneumonitis)     Martyn Ehrich, NP 02/17/2020

## 2020-02-17 NOTE — Patient Instructions (Addendum)
Intermediate for post-op pulmonary complications d/t nature of surgery and hx of COPD.   Recommend 1. Short duration of surgery as much as possible and avoid paralytic if possible 2. Recovery in step down or ICU with Pulmonary consultation if needed  3. DVT prophylaxis 4. Aggressive pulmonary toilet with o2, bronchodilatation, and incentive spirometry and early ambulation  Orders: CXR pre-op

## 2020-02-17 NOTE — Assessment & Plan Note (Signed)
-   Stable on current treatment - No recent COPD exacerbations or bronchitis in the last several months  - Lungs clear on exam, O2 saturation 97% on room air  - Continue Stiolto two puffs once daily; PRN Albuterol hfa 2 puffs every 4-6 hours for shortness of breath or wheezing  - Checking CXR for pre-op clearance

## 2020-02-17 NOTE — Assessment & Plan Note (Signed)
Cleared by pulmonary for spinal surgery with Dr. Lynann Bologna on May 27th. She is considered an intermediate risk for post-op pulmonary complications d/t nature of surgery and hx of COPD.   Recommend 1. Short duration of surgery as much as possible and avoid paralytic if possible 2. Recovery in step down or ICU with Pulmonary consultation if needed  3. DVT prophylaxis 4. Aggressive pulmonary toilet with o2, bronchodilatation, and incentive spirometry and early ambulation

## 2020-02-18 ENCOUNTER — Telehealth: Payer: Self-pay

## 2020-02-18 DIAGNOSIS — R9389 Abnormal findings on diagnostic imaging of other specified body structures: Secondary | ICD-10-CM

## 2020-02-18 NOTE — Telephone Encounter (Signed)
I need to order CT chest based on CXR

## 2020-02-18 NOTE — Telephone Encounter (Signed)
Spoke with pt, aware of results/recs.  CT ordered.  Nothing further needed at this time- will close encounter.

## 2020-02-18 NOTE — Telephone Encounter (Signed)
Susan Berger with [facility] Radiology calling in a call report on CXR 02/17/2020.  Impression copied below, full report available in Epic.  Beth please advise.  Thanks!   IMPRESSION: Questionable left apical nodular opacity. Nonenhanced chest CT suggested to further evaluate.

## 2020-02-18 NOTE — Telephone Encounter (Signed)
I reviewed pt's chart and pt has not been followed by the lung screening program. Dr Lamonte Sakai had ordered her past lung screening CT's. I will be glad to contact pt so that we can follow her regularly through the program. I saw in her ov note that she had received the first covid vaccine. Judson Roch has recommended that pt's wait at least 2 weeks after getting the vaccine to get a screening CT . If ok with you I will wait and start pt at least 2 weeks after her second vaccine. If anything changes due to pt's recent CXR report just let me know.

## 2020-02-18 NOTE — Telephone Encounter (Signed)
See telephone note 02/18/20. Will await CT results. Will keep in my box to f/u on.

## 2020-02-18 NOTE — Telephone Encounter (Signed)
Please let patient know CXR showed questionable left apical nodular opacity noted. We were going to enroll her with lung cancer screening but will needs CT chest wo contrast to evaluate. Please order.

## 2020-03-02 ENCOUNTER — Ambulatory Visit (INDEPENDENT_AMBULATORY_CARE_PROVIDER_SITE_OTHER)
Admission: RE | Admit: 2020-03-02 | Discharge: 2020-03-02 | Disposition: A | Discharge status: Home / Self Care | Payer: 59 | Source: Ambulatory Visit | Attending: Primary Care | Admitting: Primary Care

## 2020-03-02 ENCOUNTER — Other Ambulatory Visit: Payer: Self-pay

## 2020-03-02 DIAGNOSIS — R9389 Abnormal findings on diagnostic imaging of other specified body structures: Secondary | ICD-10-CM | POA: Diagnosis not present

## 2020-03-03 NOTE — Telephone Encounter (Signed)
Continue annual screening with low-dose chest CT without contrast in 12 months.

## 2020-03-03 NOTE — Progress Notes (Signed)
Please let patient know that CT chest did not show any abnormality in left apical lung that was appreciated on CXR. She did have very small bilaterally pulmonary nodules which were 31mm in size and were stable.   Continue annual screening with low-dose chest CT without contrast in 12 months. We will notify her when due for repeat scan.

## 2020-03-04 NOTE — Progress Notes (Signed)
Patient identification verified. Results of recent chest CT reviewed. Per Derl Barrow NP, please let patient know that CT chest did not show any abnormality in left apical lung that was appreciated on CXR. She did have very small bilaterally pulmonary nodules which were 16mm in size and were stable.   Continue annual screening with low-dose chest CT without contrast in 12 months. We will notify her when due for repeat scan.   Patient verbalized understanding of results and ongoing plan of care.

## 2020-03-05 NOTE — Telephone Encounter (Signed)
Referral entered to start pt and continue lung cancer screening for May 2022. Nothing further needed at this time.

## 2020-03-06 ENCOUNTER — Other Ambulatory Visit: Payer: Self-pay | Admitting: Orthopedic Surgery

## 2020-03-10 ENCOUNTER — Encounter (HOSPITAL_COMMUNITY): Payer: Self-pay

## 2020-03-10 NOTE — Progress Notes (Signed)
[id number] [place], [place] - [address] [place] [address] Phone: [phone] Fax: [phone]      Your procedure is scheduled on Thursday, 03/12/2020.  Report to [facility] Main Entrance "A" at 08:30 A.M., and check in at the Admitting office.  Call this number if you have problems the morning of surgery:  [phone]  Call [phone] if you have any questions prior to your surgery date Monday-Friday 8am-4pm    Remember:  Do not eat after midnight the night before your surgery  You may drink clear liquids until 08:30 the morning of your surgery.   Clear liquids allowed are: Water, Non-Citrus Juices (without pulp), Carbonated Beverages, Clear Tea, Black Coffee Only, and Gatorade   Enhanced Recovery after Surgery for Orthopedics Enhanced Recovery after Surgery is a protocol used to improve the stress on your body and your recovery after surgery.  Patient Instructions  . The night before surgery:  o No food after midnight. ONLY clear liquids after midnight   . The day of surgery (if you have diabetes): o  o Drink ONE (1) Gatorade 2 (G2) as directed. o This drink was given to you during your hospital  pre-op appointment visit.  o The pre-op nurse will instruct you on the time to drink the   Gatorade 2 (G2) depending on your surgery time. o Color of the Gatorade may vary. Red is not allowed. o Nothing else to drink after completing the  Gatorade 2 (G2).         If you have questions, please contact your surgeon's office.     Take these medicines the morning of surgery with A SIP OF WATER: Albuterol (Proair HFA) inhaler - if needed Loratidine (Claritin) - if needed Norco - if needed Tiotropium Bromide-Olodaterol (Stioloto Respimat)  As of today, STOP taking any Meloxicam (Mobic), Aspirin (unless otherwise instructed by your surgeon) and Aspirin containing products, Aleve, Naproxen, Ibuprofen,  Motrin, Advil, Goody's, BC's, all herbal medications, fish oil, and all vitamins.   WHAT DO I DO ABOUT MY DIABETES MEDICATION?  . Do NOT take Metformin the mornign of surgery    HOW TO MANAGE YOUR DIABETES BEFORE AND AFTER SURGERY  Why is it important to control my blood sugar before and after surgery? . Improving blood sugar levels before and after surgery helps healing and can limit problems. . A way of improving blood sugar control is eating a healthy diet by: o  Eating less sugar and carbohydrates o  Increasing activity/exercise o  Talking with your doctor about reaching your blood sugar goals . High blood sugars (greater than 180 mg/dL) can raise your risk of infections and slow your recovery, so you will need to focus on controlling your diabetes during the weeks before surgery. . Make sure that the doctor who takes care of your diabetes knows about your planned surgery including the date and location.  How do I manage my blood sugar before surgery? . Check your blood sugar at least 4 times a day, starting 2 days before surgery, to make sure that the level is not too high or low. . Check your blood sugar the morning of your surgery when you wake up and every 2 hours until you get to the Short Stay unit. o If your blood sugar is less than 70 mg/dL, you will need to treat for low blood sugar: - Do not take insulin. - Treat a low blood sugar (  less than 70 mg/dL) with  cup of clear juice (cranberry or apple), 4 glucose tablets, OR glucose gel. - Recheck blood sugar in 15 minutes after treatment (to make sure it is greater than 70 mg/dL). If your blood sugar is not greater than 70 mg/dL on recheck, call [phone] for further instructions. . Report your blood sugar to the short stay nurse when you get to Short Stay.  . If you are admitted to the hospital after surgery: o Your blood sugar will be checked by the staff and you will probably be given insulin after surgery (instead of  oral diabetes medicines) to make sure you have good blood sugar levels. o The goal for blood sugar control after surgery is 80-180 mg/dL.                      Do not wear jewelry, make up, or nail polish            Do not wear lotions, powders, perfumes, or deodorant.            Do not shave 48 hours prior to surgery.             Do not bring valuables to the hospital.            [facility] is not responsible for any belongings or valuables.  Do NOT Smoke (Tobacco/Vapping) or drink Alcohol 24 hours prior to your procedure  If you use a CPAP at night, you may bring all equipment for your overnight stay.   Contacts, glasses, dentures or bridgework may not be worn into surgery.      For patients admitted to the hospital, discharge time will be determined by your treatment team.   Patients discharged the day of surgery will not be allowed to drive home, and someone needs to stay with them for 24 hours.    Special instructions:   [facility]- Preparing For Surgery  Before surgery, you can play an important role. Because skin is not sterile, your skin needs to be as free of germs as possible. You can reduce the number of germs on your skin by washing with CHG (chlorahexidine gluconate) Soap before surgery.  CHG is an antiseptic cleaner which kills germs and bonds with the skin to continue killing germs even after washing.    Oral Hygiene is also important to reduce your risk of infection.  Remember - BRUSH YOUR TEETH THE MORNING OF SURGERY WITH YOUR REGULAR TOOTHPASTE  Please do not use if you have an allergy to CHG or antibacterial soaps. If your skin becomes reddened/irritated stop using the CHG.  Do not shave (including legs and underarms) for at least 48 hours prior to first CHG shower. It is OK to shave your face.  Please follow these instructions carefully.   1. Shower the NIGHT BEFORE SURGERY and the MORNING OF SURGERY with CHG Soap.   2. If you chose to wash your hair, wash  your hair first as usual with your normal shampoo.  3. After you shampoo, rinse your hair and body thoroughly to remove the shampoo.  4. Use CHG as you would any other liquid soap. You can apply CHG directly to the skin and wash gently with a scrungie or a clean washcloth.   5. Apply the CHG Soap to your body ONLY FROM THE NECK DOWN.  Do not use on open wounds or open sores. Avoid contact with your eyes, ears, mouth and genitals (private  parts). Wash Face and genitals (private parts)  with your normal soap.   6. Wash thoroughly, paying special attention to the area where your surgery will be performed.  7. Thoroughly rinse your body with warm water from the neck down.  8. DO NOT shower/wash with your normal soap after using and rinsing off the CHG Soap.  9. Pat yourself dry with a CLEAN TOWEL.  10. Wear CLEAN PAJAMAS to bed the night before surgery, wear comfortable clothes the morning of surgery  11. Place CLEAN SHEETS on your bed the night of your first shower and DO NOT SLEEP WITH PETS.   Day of Surgery: Shower with CHG soap as directed. Do not apply any deodorants/lotions.  Please wear clean clothes to the hospital/surgery center.   Remember to brush your teeth WITH YOUR REGULAR TOOTHPASTE.   Please read over the following fact sheets that you were given.

## 2020-03-11 ENCOUNTER — Other Ambulatory Visit: Payer: Self-pay

## 2020-03-11 ENCOUNTER — Encounter (HOSPITAL_COMMUNITY)
Admission: RE | Admit: 2020-03-11 | Discharge: 2020-03-11 | Disposition: A | Discharge status: Home / Self Care | Payer: 59 | Source: Ambulatory Visit | Attending: Orthopedic Surgery | Admitting: Orthopedic Surgery

## 2020-03-11 ENCOUNTER — Encounter (HOSPITAL_COMMUNITY): Payer: Self-pay

## 2020-03-11 ENCOUNTER — Other Ambulatory Visit (HOSPITAL_COMMUNITY)
Admission: RE | Admit: 2020-03-11 | Discharge: 2020-03-11 | Disposition: A | Discharge status: Home / Self Care | Payer: 59 | Source: Ambulatory Visit | Attending: Orthopedic Surgery | Admitting: Orthopedic Surgery

## 2020-03-11 HISTORY — DX: Personal history of urinary calculi: Z87.442

## 2020-03-11 LAB — URINALYSIS, ROUTINE W REFLEX MICROSCOPIC
Bilirubin Urine: NEGATIVE
Glucose, UA: NEGATIVE mg/dL
Hgb urine dipstick: NEGATIVE
Ketones, ur: NEGATIVE mg/dL
Leukocytes,Ua: NEGATIVE
Nitrite: NEGATIVE
Protein, ur: NEGATIVE mg/dL
Specific Gravity, Urine: 1.005 (ref 1.005–1.030)
pH: 7 (ref 5.0–8.0)

## 2020-03-11 LAB — COMPREHENSIVE METABOLIC PANEL
ALT: 55 U/L — ABNORMAL HIGH (ref 0–44)
AST: 35 U/L (ref 15–41)
Albumin: 4.6 g/dL (ref 3.5–5.0)
Alkaline Phosphatase: 52 U/L (ref 38–126)
Anion gap: 9 (ref 5–15)
BUN: 13 mg/dL (ref 6–20)
CO2: 26 mmol/L (ref 22–32)
Calcium: 9.7 mg/dL (ref 8.9–10.3)
Chloride: 105 mmol/L (ref 98–111)
Creatinine, Ser: 0.66 mg/dL (ref 0.44–1.00)
GFR calc Af Amer: >60 mL/min (ref >60)
GFR calc non Af Amer: >60 mL/min (ref >60)
Glucose, Bld: 101 mg/dL — ABNORMAL HIGH (ref 70–99)
Potassium: 3.9 mmol/L (ref 3.5–5.1)
Sodium: 140 mmol/L (ref 135–145)
Total Bilirubin: 0.7 mg/dL (ref 0.3–1.2)
Total Protein: 7.1 g/dL (ref 6.5–8.1)

## 2020-03-11 LAB — CBC WITH DIFFERENTIAL/PLATELET
Abs Immature Granulocytes: 0.03 10*3/uL (ref 0.00–0.07)
Basophils Absolute: 0 10*3/uL (ref 0.0–0.1)
Basophils Relative: 1 %
Eosinophils Absolute: 0.2 10*3/uL (ref 0.0–0.5)
Eosinophils Relative: 3 %
HCT: 43.4 % (ref 36.0–46.0)
Hemoglobin: 13.9 g/dL (ref 12.0–15.0)
Immature Granulocytes: 0 %
Lymphocytes Relative: 20 %
Lymphs Abs: 1.5 10*3/uL (ref 0.7–4.0)
MCH: 30.7 pg (ref 26.0–34.0)
MCHC: 32 g/dL (ref 30.0–36.0)
MCV: 95.8 fL (ref 80.0–100.0)
Monocytes Absolute: 0.3 10*3/uL (ref 0.1–1.0)
Monocytes Relative: 5 %
Neutro Abs: 5.4 10*3/uL (ref 1.7–7.7)
Neutrophils Relative %: 71 %
Platelets: 307 10*3/uL (ref 150–400)
RBC: 4.53 MIL/uL (ref 3.87–5.11)
RDW: 13.8 % (ref 11.5–15.5)
WBC: 7.4 10*3/uL (ref 4.0–10.5)
nRBC: 0 % (ref 0.0–0.2)

## 2020-03-11 LAB — GLUCOSE, CAPILLARY: Glucose-Capillary: 90 mg/dL (ref 70–99)

## 2020-03-11 LAB — PROTIME-INR
INR: 0.8 (ref 0.8–1.2)
Prothrombin Time: 11.1 seconds — ABNORMAL LOW (ref 11.4–15.2)

## 2020-03-11 LAB — HEMOGLOBIN A1C
Hgb A1c MFr Bld: 6.4 % — ABNORMAL HIGH (ref 4.8–5.6)
Mean Plasma Glucose: 136.98 mg/dL

## 2020-03-11 LAB — SARS CORONAVIRUS 2 (TAT 6-24 HRS): SARS Coronavirus 2: NEGATIVE

## 2020-03-11 LAB — APTT: aPTT: 31 seconds (ref 24–36)

## 2020-03-11 LAB — TYPE AND SCREEN
ABO/RH(D): A POS
Antibody Screen: NEGATIVE

## 2020-03-11 LAB — SURGICAL PCR SCREEN
MRSA, PCR: NEGATIVE
Staphylococcus aureus: NEGATIVE

## 2020-03-11 LAB — ABO/RH: ABO/RH(D): A POS

## 2020-03-11 NOTE — Progress Notes (Signed)
Spoke with Butch Penny at Dr. Laurena Bering office who stated that Dr. Lynann Bologna will have a flip room for surgery tomorrow and that the patient to arrive at [place].  Patient has been made aware.    11:27 AM Patients current surgery time already reflects Dr. Lynann Bologna getting a flip room.  Spoke with Rolla Flatten, RN (short Stay AD) who agreed that the patient should arrive at 0830 and that if there is a time change then we will make sure the patient is aware of that change.  Explained to the patient the situation and that she will arrive at 0830.  I instructed the patient that she will receive a phone call if the time does chage

## 2020-03-11 NOTE — Progress Notes (Signed)
PCP - Domenick Gong Cardiologist - denies  Chest x-ray - 02/18/20 EKG - 03/11/20 Stress Test - denies ECHO - denies Cardiac Cath -denies   Fasting Blood Sugar - 110-140 Checks Blood Sugar ___1__ times a day   ERAS Protcol - yes PRE-SURGERY Ensure or G2- G2 given  COVID TEST- 03/11/20   Anesthesia review: no  Patient denies shortness of breath, fever, cough and chest pain at PAT appointment   All instructions explained to the patient, with a verbal understanding of the material. Patient agrees to go over the instructions while at home for a better understanding. Patient also instructed to self quarantine after being tested for COVID-19. The opportunity to ask questions was provided.

## 2020-03-12 ENCOUNTER — Inpatient Hospital Stay (HOSPITAL_COMMUNITY)
Admission: RE | Admit: 2020-03-12 | Discharge: 2020-03-13 | DRG: 455 | Disposition: A | Discharge status: Home / Self Care | Payer: 59 | Attending: Orthopedic Surgery | Admitting: Orthopedic Surgery

## 2020-03-12 ENCOUNTER — Encounter (HOSPITAL_COMMUNITY): Payer: Self-pay | Admitting: Orthopedic Surgery

## 2020-03-12 ENCOUNTER — Inpatient Hospital Stay (HOSPITAL_COMMUNITY)
Admission: RE | Disposition: A | Discharge status: Home / Self Care | Payer: Self-pay | Source: Home / Self Care | Attending: Orthopedic Surgery

## 2020-03-12 ENCOUNTER — Inpatient Hospital Stay (HOSPITAL_COMMUNITY): Payer: 59 | Admitting: Emergency Medicine

## 2020-03-12 ENCOUNTER — Inpatient Hospital Stay (HOSPITAL_COMMUNITY): Payer: 59

## 2020-03-12 ENCOUNTER — Other Ambulatory Visit: Payer: Self-pay

## 2020-03-12 DIAGNOSIS — E119 Type 2 diabetes mellitus without complications: Secondary | ICD-10-CM | POA: Diagnosis present

## 2020-03-12 DIAGNOSIS — Z20822 Contact with and (suspected) exposure to covid-19: Secondary | ICD-10-CM | POA: Diagnosis present

## 2020-03-12 DIAGNOSIS — M48061 Spinal stenosis, lumbar region without neurogenic claudication: Secondary | ICD-10-CM | POA: Diagnosis present

## 2020-03-12 DIAGNOSIS — M5416 Radiculopathy, lumbar region: Secondary | ICD-10-CM | POA: Diagnosis present

## 2020-03-12 DIAGNOSIS — Z87891 Personal history of nicotine dependence: Secondary | ICD-10-CM | POA: Diagnosis not present

## 2020-03-12 DIAGNOSIS — Z825 Family history of asthma and other chronic lower respiratory diseases: Secondary | ICD-10-CM

## 2020-03-12 DIAGNOSIS — M5410 Radiculopathy, site unspecified: Secondary | ICD-10-CM | POA: Diagnosis present

## 2020-03-12 DIAGNOSIS — Z7984 Long term (current) use of oral hypoglycemic drugs: Secondary | ICD-10-CM | POA: Diagnosis not present

## 2020-03-12 DIAGNOSIS — Z79899 Other long term (current) drug therapy: Secondary | ICD-10-CM | POA: Diagnosis not present

## 2020-03-12 DIAGNOSIS — Z801 Family history of malignant neoplasm of trachea, bronchus and lung: Secondary | ICD-10-CM

## 2020-03-12 DIAGNOSIS — J449 Chronic obstructive pulmonary disease, unspecified: Secondary | ICD-10-CM | POA: Diagnosis present

## 2020-03-12 DIAGNOSIS — Z419 Encounter for procedure for purposes other than remedying health state, unspecified: Secondary | ICD-10-CM

## 2020-03-12 DIAGNOSIS — F329 Major depressive disorder, single episode, unspecified: Secondary | ICD-10-CM | POA: Diagnosis present

## 2020-03-12 DIAGNOSIS — Z791 Long term (current) use of non-steroidal anti-inflammatories (NSAID): Secondary | ICD-10-CM

## 2020-03-12 DIAGNOSIS — M419 Scoliosis, unspecified: Secondary | ICD-10-CM | POA: Diagnosis present

## 2020-03-12 DIAGNOSIS — E785 Hyperlipidemia, unspecified: Secondary | ICD-10-CM | POA: Diagnosis present

## 2020-03-12 DIAGNOSIS — M79605 Pain in left leg: Secondary | ICD-10-CM | POA: Diagnosis present

## 2020-03-12 HISTORY — PX: ANTERIOR LAT LUMBAR FUSION: SHX1168

## 2020-03-12 LAB — GLUCOSE, CAPILLARY
Glucose-Capillary: 94 mg/dL (ref 70–99)
Glucose-Capillary: 141 mg/dL — ABNORMAL HIGH (ref 70–99)
Glucose-Capillary: 143 mg/dL — ABNORMAL HIGH (ref 70–99)

## 2020-03-12 SURGERY — ANTERIOR LATERAL LUMBAR FUSION 2 LEVELS
Anesthesia: General | Site: Spine Lumbar

## 2020-03-12 MED ORDER — DEXAMETHASONE SODIUM PHOSPHATE 10 MG/ML IJ SOLN
INTRAMUSCULAR | Status: DC | PRN
  Administered 2020-03-12: 5 mg via INTRAVENOUS

## 2020-03-12 MED ORDER — HYDROMORPHONE HCL 1 MG/ML IJ SOLN
INTRAMUSCULAR | Status: DC | PRN
  Administered 2020-03-12: .5 mg via INTRAVENOUS

## 2020-03-12 MED ORDER — LACTATED RINGERS IV SOLN: INTRAVENOUS | Status: DC | PRN

## 2020-03-12 MED ORDER — LIDOCAINE 2% (20 MG/ML) 5 ML SYRINGE
INTRAMUSCULAR | Status: AC
  Filled 2020-03-12: qty 10

## 2020-03-12 MED ORDER — SODIUM CHLORIDE 0.9 % IV SOLN: 250.0000 mL | INTRAVENOUS | Status: DC

## 2020-03-12 MED ORDER — PROMETHAZINE HCL 25 MG/ML IJ SOLN: 6.2500 mg | INTRAMUSCULAR | Status: DC | PRN

## 2020-03-12 MED ORDER — 0.9 % SODIUM CHLORIDE (POUR BTL) OPTIME
TOPICAL | Status: DC | PRN
  Administered 2020-03-12 (×2): 1000 mL

## 2020-03-12 MED ORDER — CEFAZOLIN SODIUM 1 G IJ SOLR
INTRAMUSCULAR | Status: AC
  Filled 2020-03-12: qty 20

## 2020-03-12 MED ORDER — PROPOFOL 10 MG/ML IV BOLUS
INTRAVENOUS | Status: AC
  Filled 2020-03-12: qty 20

## 2020-03-12 MED ORDER — FENTANYL CITRATE (PF) 250 MCG/5ML IJ SOLN
INTRAMUSCULAR | Status: AC
  Filled 2020-03-12: qty 5

## 2020-03-12 MED ORDER — OXYCODONE HCL 5 MG PO TABS
5.0000 mg | ORAL_TABLET | Freq: Once | ORAL | Status: AC | PRN
  Administered 2020-03-12: 5 mg via ORAL

## 2020-03-12 MED ORDER — EPINEPHRINE 1 MG/10ML IJ SOSY
PREFILLED_SYRINGE | INTRAMUSCULAR | Status: DC | PRN
  Administered 2020-03-12: 0.1 mg via INTRAVENOUS

## 2020-03-12 MED ORDER — ALUM & MAG HYDROXIDE-SIMETH 200-200-20 MG/5ML PO SUSP: 30.0000 mL | Freq: Four times a day (QID) | ORAL | Status: DC | PRN

## 2020-03-12 MED ORDER — THROMBIN 20000 UNITS EX SOLR
CUTANEOUS | Status: DC | PRN
  Administered 2020-03-12: 20000 [IU] via TOPICAL

## 2020-03-12 MED ORDER — ACETAMINOPHEN 325 MG PO TABS: 650.0000 mg | ORAL_TABLET | ORAL | Status: DC | PRN

## 2020-03-12 MED ORDER — ALBUTEROL SULFATE (2.5 MG/3ML) 0.083% IN NEBU: 3.0000 mL | INHALATION_SOLUTION | Freq: Four times a day (QID) | RESPIRATORY_TRACT | Status: DC | PRN

## 2020-03-12 MED ORDER — METHOCARBAMOL 500 MG PO TABS
500.0000 mg | ORAL_TABLET | Freq: Four times a day (QID) | ORAL | Status: DC | PRN
  Administered 2020-03-12 – 2020-03-13 (×3): 500 mg via ORAL
  Filled 2020-03-12 (×3): qty 1

## 2020-03-12 MED ORDER — ACETAMINOPHEN 10 MG/ML IV SOLN
INTRAVENOUS | Status: AC
  Filled 2020-03-12: qty 100

## 2020-03-12 MED ORDER — FENTANYL CITRATE (PF) 100 MCG/2ML IJ SOLN
25.0000 ug | INTRAMUSCULAR | Status: DC | PRN
  Administered 2020-03-12 (×2): 25 ug via INTRAVENOUS

## 2020-03-12 MED ORDER — MAGNESIUM OXIDE 400 MG PO TABS
400.0000 mg | ORAL_TABLET | Freq: Every evening | ORAL | Status: DC
  Filled 2020-03-12: qty 1

## 2020-03-12 MED ORDER — CEFAZOLIN SODIUM-DEXTROSE 2-4 GM/100ML-% IV SOLN
2.0000 g | Freq: Three times a day (TID) | INTRAVENOUS | Status: AC
  Administered 2020-03-12 – 2020-03-13 (×2): 2 g via INTRAVENOUS
  Filled 2020-03-12 (×2): qty 100

## 2020-03-12 MED ORDER — POVIDONE-IODINE 7.5 % EX SOLN
Freq: Once | CUTANEOUS | Status: DC
  Filled 2020-03-12: qty 118

## 2020-03-12 MED ORDER — ATORVASTATIN CALCIUM 80 MG PO TABS
80.0000 mg | ORAL_TABLET | Freq: Every evening | ORAL | Status: DC
  Administered 2020-03-12: 80 mg via ORAL
  Filled 2020-03-12: qty 1

## 2020-03-12 MED ORDER — ONDANSETRON HCL 4 MG/2ML IJ SOLN
INTRAMUSCULAR | Status: DC | PRN
  Administered 2020-03-12: 4 mg via INTRAVENOUS

## 2020-03-12 MED ORDER — ORAL CARE MOUTH RINSE: 15.0000 mL | Freq: Once | OROMUCOSAL | Status: AC

## 2020-03-12 MED ORDER — DULOXETINE HCL 30 MG PO CPEP
60.0000 mg | ORAL_CAPSULE | Freq: Every evening | ORAL | Status: DC
  Administered 2020-03-12: 60 mg via ORAL
  Filled 2020-03-12: qty 2

## 2020-03-12 MED ORDER — OXYCODONE-ACETAMINOPHEN 5-325 MG PO TABS
1.0000 | ORAL_TABLET | ORAL | Status: DC | PRN
  Administered 2020-03-12 – 2020-03-13 (×4): 2 via ORAL
  Filled 2020-03-12 (×4): qty 2

## 2020-03-12 MED ORDER — DEXAMETHASONE SODIUM PHOSPHATE 10 MG/ML IJ SOLN
INTRAMUSCULAR | Status: AC
  Filled 2020-03-12: qty 1

## 2020-03-12 MED ORDER — SODIUM CHLORIDE 0.9% FLUSH: 3.0000 mL | Freq: Two times a day (BID) | INTRAVENOUS | Status: DC

## 2020-03-12 MED ORDER — BUPIVACAINE-EPINEPHRINE 0.25% -1:200000 IJ SOLN
INTRAMUSCULAR | Status: DC | PRN
  Administered 2020-03-12: 18 mL
  Administered 2020-03-12: 7 mL
  Administered 2020-03-12: 5 mL

## 2020-03-12 MED ORDER — LORATADINE 10 MG PO TABS: 10.0000 mg | ORAL_TABLET | Freq: Every day | ORAL | Status: DC | PRN

## 2020-03-12 MED ORDER — THROMBIN (RECOMBINANT) 20000 UNITS EX SOLR
CUTANEOUS | Status: AC
  Filled 2020-03-12: qty 20000

## 2020-03-12 MED ORDER — ACETAMINOPHEN 650 MG RE SUPP: 650.0000 mg | RECTAL | Status: DC | PRN

## 2020-03-12 MED ORDER — KETAMINE HCL 100 MG/ML IJ SOLN
INTRAMUSCULAR | Status: AC
  Filled 2020-03-12: qty 1

## 2020-03-12 MED ORDER — BUPIVACAINE HCL (PF) 0.25 % IJ SOLN
INTRAMUSCULAR | Status: AC
  Filled 2020-03-12: qty 30

## 2020-03-12 MED ORDER — DOCUSATE SODIUM 100 MG PO CAPS
100.0000 mg | ORAL_CAPSULE | Freq: Two times a day (BID) | ORAL | Status: DC
  Administered 2020-03-12 – 2020-03-13 (×2): 100 mg via ORAL
  Filled 2020-03-12 (×2): qty 1

## 2020-03-12 MED ORDER — SENNOSIDES-DOCUSATE SODIUM 8.6-50 MG PO TABS
1.0000 | ORAL_TABLET | Freq: Every evening | ORAL | Status: DC | PRN
  Administered 2020-03-12: 1 via ORAL
  Filled 2020-03-12: qty 1

## 2020-03-12 MED ORDER — PROPOFOL 10 MG/ML IV BOLUS
INTRAVENOUS | Status: DC | PRN
  Administered 2020-03-12 (×4): 50 mg via INTRAVENOUS
  Administered 2020-03-12: 150 mg via INTRAVENOUS

## 2020-03-12 MED ORDER — FENTANYL CITRATE (PF) 100 MCG/2ML IJ SOLN
INTRAMUSCULAR | Status: AC
  Filled 2020-03-12: qty 2

## 2020-03-12 MED ORDER — SUCCINYLCHOLINE CHLORIDE 200 MG/10ML IV SOSY
PREFILLED_SYRINGE | INTRAVENOUS | Status: AC
  Filled 2020-03-12: qty 20

## 2020-03-12 MED ORDER — ESMOLOL HCL-SODIUM CHLORIDE 2000 MG/100ML IV SOLN
INTRAVENOUS | Status: DC | PRN
Start: 2020-03-12 — End: 2020-03-12
  Administered 2020-03-12: 20 mg via INTRAVENOUS

## 2020-03-12 MED ORDER — BUPIVACAINE LIPOSOME 1.3 % IJ SUSP
20.0000 mL | Freq: Once | INTRAMUSCULAR | Status: DC
  Filled 2020-03-12: qty 20

## 2020-03-12 MED ORDER — CHLORHEXIDINE GLUCONATE 0.12 % MT SOLN
15.0000 mL | Freq: Once | OROMUCOSAL | Status: AC
  Administered 2020-03-12: 15 mL via OROMUCOSAL
  Filled 2020-03-12: qty 15

## 2020-03-12 MED ORDER — HYDROMORPHONE HCL 1 MG/ML IJ SOLN
INTRAMUSCULAR | Status: AC
  Filled 2020-03-12: qty 0.5

## 2020-03-12 MED ORDER — BUPIVACAINE LIPOSOME 1.3 % IJ SUSP
INTRAMUSCULAR | Status: DC | PRN
  Administered 2020-03-12: 20 mL

## 2020-03-12 MED ORDER — ZOLPIDEM TARTRATE 5 MG PO TABS: 5.0000 mg | ORAL_TABLET | Freq: Every evening | ORAL | Status: DC | PRN

## 2020-03-12 MED ORDER — SUCCINYLCHOLINE CHLORIDE 200 MG/10ML IV SOSY
PREFILLED_SYRINGE | INTRAVENOUS | Status: DC | PRN
  Administered 2020-03-12: 100 mg via INTRAVENOUS

## 2020-03-12 MED ORDER — FLEET ENEMA 7-19 GM/118ML RE ENEM: 1.0000 | ENEMA | Freq: Once | RECTAL | Status: DC | PRN

## 2020-03-12 MED ORDER — PHENOL 1.4 % MT LIQD: 1.0000 | OROMUCOSAL | Status: DC | PRN

## 2020-03-12 MED ORDER — ONDANSETRON HCL 4 MG/2ML IJ SOLN
INTRAMUSCULAR | Status: AC
  Filled 2020-03-12: qty 2

## 2020-03-12 MED ORDER — PROPOFOL 10 MG/ML IV BOLUS
INTRAVENOUS | Status: AC
  Filled 2020-03-12: qty 40

## 2020-03-12 MED ORDER — CEFAZOLIN SODIUM-DEXTROSE 2-4 GM/100ML-% IV SOLN
2.0000 g | INTRAVENOUS | Status: AC
  Administered 2020-03-12 (×2): 2 g via INTRAVENOUS
  Filled 2020-03-12: qty 100

## 2020-03-12 MED ORDER — BISACODYL 5 MG PO TBEC: 5.0000 mg | DELAYED_RELEASE_TABLET | Freq: Every day | ORAL | Status: DC | PRN

## 2020-03-12 MED ORDER — METHOCARBAMOL 1000 MG/10ML IJ SOLN
500.0000 mg | Freq: Four times a day (QID) | INTRAVENOUS | Status: DC | PRN
  Filled 2020-03-12: qty 5

## 2020-03-12 MED ORDER — MAGNESIUM OXIDE 400 (241.3 MG) MG PO TABS
400.0000 mg | ORAL_TABLET | Freq: Every evening | ORAL | Status: DC
  Administered 2020-03-12: 400 mg via ORAL
  Filled 2020-03-12: qty 1

## 2020-03-12 MED ORDER — PHENYLEPHRINE HCL-NACL 10-0.9 MG/250ML-% IV SOLN
INTRAVENOUS | Status: DC | PRN
  Administered 2020-03-12: 20 ug/min via INTRAVENOUS

## 2020-03-12 MED ORDER — MORPHINE SULFATE (PF) 2 MG/ML IV SOLN
1.0000 mg | INTRAVENOUS | Status: DC | PRN
  Administered 2020-03-12: 2 mg via INTRAVENOUS
  Filled 2020-03-12: qty 1

## 2020-03-12 MED ORDER — MIDAZOLAM HCL 5 MG/5ML IJ SOLN
INTRAMUSCULAR | Status: DC | PRN
  Administered 2020-03-12: 2 mg via INTRAVENOUS

## 2020-03-12 MED ORDER — HEMOSTATIC AGENTS (NO CHARGE) OPTIME
TOPICAL | Status: DC | PRN
  Administered 2020-03-12: 1 via TOPICAL

## 2020-03-12 MED ORDER — POTASSIUM CHLORIDE IN NACL 20-0.9 MEQ/L-% IV SOLN: INTRAVENOUS | Status: DC

## 2020-03-12 MED ORDER — ACETAMINOPHEN 10 MG/ML IV SOLN
INTRAVENOUS | Status: DC | PRN
Start: 2020-03-12 — End: 2020-03-12
  Administered 2020-03-12: 1000 mg via INTRAVENOUS

## 2020-03-12 MED ORDER — LABETALOL HCL 5 MG/ML IV SOLN
INTRAVENOUS | Status: AC
  Filled 2020-03-12: qty 4

## 2020-03-12 MED ORDER — FENTANYL CITRATE (PF) 100 MCG/2ML IJ SOLN
INTRAMUSCULAR | Status: DC | PRN
  Administered 2020-03-12: 100 ug via INTRAVENOUS
  Administered 2020-03-12 (×5): 50 ug via INTRAVENOUS
  Administered 2020-03-12 (×2): 100 ug via INTRAVENOUS
  Administered 2020-03-12: 50 ug via INTRAVENOUS
  Administered 2020-03-12: 100 ug via INTRAVENOUS
  Administered 2020-03-12: 50 ug via INTRAVENOUS

## 2020-03-12 MED ORDER — KETAMINE HCL 10 MG/ML IJ SOLN
INTRAMUSCULAR | Status: DC | PRN
  Administered 2020-03-12: 50 mg via INTRAVENOUS

## 2020-03-12 MED ORDER — MENTHOL 3 MG MT LOZG: 1.0000 | LOZENGE | OROMUCOSAL | Status: DC | PRN

## 2020-03-12 MED ORDER — MIDAZOLAM HCL 2 MG/2ML IJ SOLN
INTRAMUSCULAR | Status: AC
  Filled 2020-03-12: qty 2

## 2020-03-12 MED ORDER — ONDANSETRON HCL 4 MG PO TABS: 4.0000 mg | ORAL_TABLET | Freq: Four times a day (QID) | ORAL | Status: DC | PRN

## 2020-03-12 MED ORDER — OXYCODONE HCL 5 MG/5ML PO SOLN: 5.0000 mg | Freq: Once | ORAL | Status: AC | PRN

## 2020-03-12 MED ORDER — SODIUM CHLORIDE 0.9% FLUSH: 3.0000 mL | INTRAVENOUS | Status: DC | PRN

## 2020-03-12 MED ORDER — LIDOCAINE 2% (20 MG/ML) 5 ML SYRINGE
INTRAMUSCULAR | Status: DC | PRN
  Administered 2020-03-12: 60 mg via INTRAVENOUS

## 2020-03-12 MED ORDER — EPINEPHRINE PF 1 MG/ML IJ SOLN
INTRAMUSCULAR | Status: AC
  Filled 2020-03-12: qty 1

## 2020-03-12 MED ORDER — OXYCODONE HCL 5 MG PO TABS
ORAL_TABLET | ORAL | Status: AC
  Filled 2020-03-12: qty 1

## 2020-03-12 MED ORDER — METFORMIN HCL 500 MG PO TABS
500.0000 mg | ORAL_TABLET | Freq: Two times a day (BID) | ORAL | Status: DC
  Administered 2020-03-13: 500 mg via ORAL
  Filled 2020-03-12: qty 1

## 2020-03-12 MED ORDER — ONDANSETRON HCL 4 MG/2ML IJ SOLN: 4.0000 mg | Freq: Four times a day (QID) | INTRAMUSCULAR | Status: DC | PRN

## 2020-03-12 MED ORDER — PROPOFOL 500 MG/50ML IV EMUL
INTRAVENOUS | Status: DC | PRN
  Administered 2020-03-12: 150 ug/kg/min via INTRAVENOUS
  Administered 2020-03-12: 75 ug/kg/min via INTRAVENOUS

## 2020-03-12 SURGICAL SUPPLY — 92 items
BENZOIN TINCTURE PRP APPL 2/3 (GAUZE/BANDAGES/DRESSINGS) ×8 IMPLANT
BLADE CLIPPER SURG (BLADE) IMPLANT
BLADE SURG 10 STRL SS (BLADE) ×4 IMPLANT
BUR PRESCISION 1.7 ELITE (BURR) IMPLANT
BUR ROUND FLUTED 5 RND (BURR) IMPLANT
BUR ROUND PRECISION 4.0 (BURR) IMPLANT
BUR SABER RD CUTTING 3.0 (BURR) IMPLANT
CAGE COROENT XL 10X18X150 (Cage) ×8 IMPLANT
CARTRIDGE OIL MAESTRO DRILL (MISCELLANEOUS) IMPLANT
CNTNR URN SCR LID CUP LEK RST (MISCELLANEOUS) IMPLANT
CONT SPEC 4OZ STRL OR WHT (MISCELLANEOUS)
CORD BIPOLAR FORCEPS 12FT (ELECTRODE) ×8 IMPLANT
COVER BACK TABLE 60X90IN (DRAPES) ×4 IMPLANT
COVER SURGICAL LIGHT HANDLE (MISCELLANEOUS) ×8 IMPLANT
DRAIN CHANNEL 15F RND FF W/TCR (WOUND CARE) IMPLANT
DRAPE C-ARM 42X72 X-RAY (DRAPES) ×8 IMPLANT
DRAPE C-ARMOR (DRAPES) ×8 IMPLANT
DRAPE POUCH INSTRU U-SHP 10X18 (DRAPES) ×4 IMPLANT
DRAPE SURG 17X23 STRL (DRAPES) ×16 IMPLANT
DURAPREP 26ML APPLICATOR (WOUND CARE) ×4 IMPLANT
ELECT BLADE 4.0 EZ CLEAN MEGAD (MISCELLANEOUS)
ELECT CAUTERY BLADE 6.4 (BLADE) ×4 IMPLANT
ELECT REM PT RETURN 9FT ADLT (ELECTROSURGICAL) ×4
ELECTRODE BLDE 4.0 EZ CLN MEGD (MISCELLANEOUS) IMPLANT
ELECTRODE REM PT RTRN 9FT ADLT (ELECTROSURGICAL) ×3 IMPLANT
EVACUATOR SILICONE 100CC (DRAIN) IMPLANT
FILTER STRAW FLUID ASPIR (MISCELLANEOUS) IMPLANT
GAUZE 4X4 16PLY RFD (DISPOSABLE) ×8 IMPLANT
GAUZE SPONGE 4X4 12PLY STRL (GAUZE/BANDAGES/DRESSINGS) ×4 IMPLANT
GLOVE BIO SURGEON STRL SZ7 (GLOVE) ×8 IMPLANT
GLOVE BIO SURGEON STRL SZ8 (GLOVE) ×8 IMPLANT
GLOVE BIOGEL PI IND STRL 7.0 (GLOVE) ×6 IMPLANT
GLOVE BIOGEL PI IND STRL 8 (GLOVE) ×6 IMPLANT
GLOVE BIOGEL PI INDICATOR 7.0 (GLOVE) ×2
GLOVE BIOGEL PI INDICATOR 8 (GLOVE) ×2
GOWN STRL REUS W/ TWL LRG LVL3 (GOWN DISPOSABLE) ×9 IMPLANT
GOWN STRL REUS W/ TWL XL LVL3 (GOWN DISPOSABLE) ×6 IMPLANT
GOWN STRL REUS W/TWL LRG LVL3 (GOWN DISPOSABLE) ×9
GOWN STRL REUS W/TWL XL LVL3 (GOWN DISPOSABLE) ×6
GUIDEWIRE SHARP VIPER II (WIRE) ×32 IMPLANT
IV CATH 14GX2 1/4 (CATHETERS) ×4 IMPLANT
KIT ALARA NEURO ACCESS (KITS) ×8 IMPLANT
KIT BASIN OR (CUSTOM PROCEDURE TRAY) ×4 IMPLANT
KIT DILATOR XLIF 5 (KITS) ×4 IMPLANT
KIT POSITION SURG JACKSON T1 (MISCELLANEOUS) ×4 IMPLANT
KIT SURGICAL ACCESS MAXCESS 4 (KITS) ×4 IMPLANT
KIT TURNOVER KIT B (KITS) ×4 IMPLANT
MARKER SKIN DUAL TIP RULER LAB (MISCELLANEOUS) ×8 IMPLANT
MIX DBX 20CC MTF (Putty) ×4 IMPLANT
MODULE EMG NEEDLE SSEP NVM5 (NEEDLE) ×4 IMPLANT
MODULE NVM5 NEXT GEN EMG (NEEDLE) ×4 IMPLANT
NEEDLE 18GX1X1/2 (RX/OR ONLY) (NEEDLE) ×4 IMPLANT
NEEDLE 22X1 1/2 (OR ONLY) (NEEDLE) ×8 IMPLANT
NEEDLE FILTER BLUNT 18X 1/2SAF (NEEDLE) ×1
NEEDLE FILTER BLUNT 18X1 1/2 (NEEDLE) ×3 IMPLANT
NEEDLE HYPO 25GX1X1/2 BEV (NEEDLE) ×4 IMPLANT
NEEDLE SPNL 18GX3.5 QUINCKE PK (NEEDLE) ×8 IMPLANT
NS IRRIG 1000ML POUR BTL (IV SOLUTION) ×8 IMPLANT
OIL CARTRIDGE MAESTRO DRILL (MISCELLANEOUS)
PACK LAMINECTOMY ORTHO (CUSTOM PROCEDURE TRAY) ×4 IMPLANT
PACK UNIVERSAL I (CUSTOM PROCEDURE TRAY) ×12 IMPLANT
PAD ARMBOARD 7.5X6 YLW CONV (MISCELLANEOUS) ×8 IMPLANT
PATTIES SURGICAL .5 X1 (DISPOSABLE) ×4 IMPLANT
PATTIES SURGICAL .5X1.5 (GAUZE/BANDAGES/DRESSINGS) IMPLANT
PENCIL BUTTON HOLSTER BLD 10FT (ELECTRODE) ×4 IMPLANT
ROD LORDOSED 75MM VIPER 2 (Rod) ×8 IMPLANT
SCREW POLY VIPER2 7X40MM (Screw) ×8 IMPLANT
SCREW SET SINGLE INNER MIS (Screw) ×24 IMPLANT
SCREW XTAB POLY VIPER  7X45 (Screw) ×12 IMPLANT
SCREW XTAB POLY VIPER 7X45 (Screw) ×12 IMPLANT
SPONGE INTESTINAL PEANUT (DISPOSABLE) ×12 IMPLANT
SPONGE SURGIFOAM ABS GEL 100 (HEMOSTASIS) ×4 IMPLANT
STRIP CLOSURE SKIN 1/2X4 (GAUZE/BANDAGES/DRESSINGS) ×8 IMPLANT
SURGIFLO W/THROMBIN 8M KIT (HEMOSTASIS) IMPLANT
SUT MNCRL AB 4-0 PS2 18 (SUTURE) ×8 IMPLANT
SUT VIC AB 0 CT1 18XCR BRD 8 (SUTURE) ×3 IMPLANT
SUT VIC AB 0 CT1 8-18 (SUTURE) ×4
SUT VIC AB 1 CT1 18XCR BRD 8 (SUTURE) ×6 IMPLANT
SUT VIC AB 1 CT1 8-18 (SUTURE) ×6
SUT VIC AB 2-0 CT2 18 VCP726D (SUTURE) ×16 IMPLANT
SYR 20ML LL LF (SYRINGE) ×8 IMPLANT
SYR BULB IRRIG 60ML STRL (SYRINGE) ×4 IMPLANT
SYR CONTROL 10ML LL (SYRINGE) ×16 IMPLANT
SYR TB 1ML LUER SLIP (SYRINGE) ×4 IMPLANT
TAP CANN VIPER2 DL 5.0 (TAP) ×4 IMPLANT
TAP CANN VIPER2 DL 6.0 (TAP) ×4 IMPLANT
TAP CANN VIPER2 DL 7.0 (TAP) ×4 IMPLANT
TAPE PAPER 3X10 WHT MICROPORE (GAUZE/BANDAGES/DRESSINGS) ×8 IMPLANT
TRAY FOLEY MTR SLVR 16FR STAT (SET/KITS/TRAYS/PACK) ×4 IMPLANT
TUBE CONNECTING 12X1/4 (SUCTIONS) ×4 IMPLANT
WATER STERILE IRR 1000ML POUR (IV SOLUTION) ×4 IMPLANT
YANKAUER SUCT BULB TIP NO VENT (SUCTIONS) ×8 IMPLANT

## 2020-03-12 NOTE — Anesthesia Preprocedure Evaluation (Addendum)
Anesthesia Evaluation  Patient identified by MRN, date of birth, ID band Patient awake    Reviewed: Allergy & Precautions, NPO status , Patient's Chart, lab work & pertinent test results  History of Anesthesia Complications Negative for: history of anesthetic complications  Airway Mallampati: II  TM Distance: >3 FB Neck ROM: Full    Dental  (+) Dental Advisory Given, Teeth Intact   Pulmonary COPD,  COPD inhaler, former smoker,    Pulmonary exam normal        Cardiovascular negative cardio ROS Normal cardiovascular exam     Neuro/Psych  Headaches, PSYCHIATRIC DISORDERS Anxiety Depression    GI/Hepatic negative GI ROS, Neg liver ROS,   Endo/Other  diabetes, Type 2, Oral Hypoglycemic Agents  Renal/GU negative Renal ROS     Musculoskeletal negative musculoskeletal ROS (+)   Abdominal   Peds  Hematology negative hematology ROS (+)   Anesthesia Other Findings Covid neg 5/26  Reproductive/Obstetrics                            Anesthesia Physical Anesthesia Plan  ASA: II  Anesthesia Plan: General   Post-op Pain Management:    Induction: Intravenous  PONV Risk Score and Plan: 4 or greater and Treatment may vary due to age or medical condition, Ondansetron, Midazolam, Dexamethasone and Scopolamine patch - Pre-op  Airway Management Planned: Oral ETT  Additional Equipment: None  Intra-op Plan:   Post-operative Plan: Extubation in OR  Informed Consent: I have reviewed the patients History and Physical, chart, labs and discussed the procedure including the risks, benefits and alternatives for the proposed anesthesia with the patient or authorized representative who has indicated his/her understanding and acceptance.     Dental advisory given  Plan Discussed with: CRNA and Anesthesiologist  Anesthesia Plan Comments:        Anesthesia Quick Evaluation

## 2020-03-12 NOTE — Anesthesia Procedure Notes (Signed)
Procedure Name: Intubation Date/Time: 03/12/2020 11:04 AM Performed by: Gwyndolyn Saxon, CRNA Pre-anesthesia Checklist: Patient identified, Emergency Drugs available, Suction available and Patient being monitored Patient Re-evaluated:Patient Re-evaluated prior to induction Oxygen Delivery Method: Circle system utilized Preoxygenation: Pre-oxygenation with 100% oxygen Induction Type: IV induction Ventilation: Mask ventilation without difficulty Laryngoscope Size: Mac and 3 Grade View: Grade I Tube type: Oral Tube size: 7.0 mm Number of attempts: 1 Airway Equipment and Method: Stylet Placement Confirmation: ETT inserted through vocal cords under direct vision,  positive ETCO2 and breath sounds checked- equal and bilateral Secured at: 22 cm Tube secured with: Tape Dental Injury: Teeth and Oropharynx as per pre-operative assessment

## 2020-03-12 NOTE — Op Note (Signed)
PATIENT NAME: Susan Berger   MEDICAL RECORD NO.:   [id number]    DATE OF BIRTH: [birth year]   DATE OF PROCEDURE: 03/12/2020                               OPERATIVE REPORT     PREOPERATIVE DIAGNOSES: 1. Severe left-sided lumbar radiculopathy. 2. Left-sided nerve compression at L2-3 and L3-4, with asymmetric segmental collapse and foraminal stenosis noted 3.  Thoracolumbar scoliosis    POSTOPERATIVE DIAGNOSES: 1. Severe left-sided lumbar radiculopathy. 2. Left-sided nerve compression at L2-3 and L3-4, with asymmetric segmental collapse and foraminal stenosis noted 3.  Thoracolumbar scoliosis   PROCEDURE: 1. Direct lateral interbody fusion, via a left-sided approach, L2-3, L3-4 2. Insertion of interbody device x2 (46mm x 29mm x 35mm NuVasive intervertebral spacers     x 2). 3.  Use of morselized allograft - DBX mix 4.  Posterior spinal fusion, L2-3, L3-4 5.  Placement of posterior segmental instrumentation L2, L3, L4 bilaterally 6 . Intraoperative use of fluoroscopy.   SURGEON:  Phylliss Bob, MD.   ASSISTANTPricilla Holm, PA-C.   ANESTHESIA:  General endotracheal anesthesia.   COMPLICATIONS:  None.   DISPOSITION:  Stable.   ESTIMATED BLOOD LOSS:  Minimal.   INDICATIONS FOR SURGERY:  Briefly, Ms. Lemmon is a pleasant 59 year old female who did present to me in the office with ongoing severe pain in her left leg.  The pain has been debilitating and severely limiting her function.  The pain has involved the anterior thigh and shin.  She did get excellent relief with left L3 and L4 nerve blocks.  Her MRI was notable for multiple findings, however, given the location of her pain and her response to her injections, it did appear to be very likely that her pain was coming from her L3 and L4 nerves.  Her most recent MRI did reveal increased disc protrusions at the L3-4 and L4-5 levels, correlating to her symptoms.  She was also noted to have a thoracolumbar scoliosis.   Given her ongoing pain and lack of improvement with appropriate conservative treatment measures, we did discuss proceeding with a lateral/posterior procedure as noted above.  The patient did wish to proceed.  The patient did fully understand that she did have degenerative changes at her adjacent segments, which may potentially need to be addressed surgically in the future.  She did express an understanding of this and did wish to proceed.    OPERATIVE DETAILS:  On 03/12/2020, the patient was brought to surgery and general endotracheal anesthesia was administered.  The patient was placed in the lateral decubitus position with the left side up.  The patient's hips and knees were flexed and she was secured to the bed. Her left arm was placed on an arm board.  All bony prominences were meticulously padded.  The bed was appropriately angled in order to ensure a perfect AP and lateral trajectory during surgery.  The patient's left flank was then prepped and draped in the usual sterile fashion.  A time-out procedure was performed, and antibiotics were given. At this point, a left-sided incision was made between the L2-3 and L3-4 intervertebral spaces.  The external and internal oblique musculature was dissected, and the transversalis fascia was entered, and the retroperitoneal space was noted.  The peritoneum was bluntly swept anteriorly.  I then was able to identify the psoas muscle.  I did use the initial dilator,  which was advanced through the psoas muscle, which was docked over the L3-4 intervertebral space, liberally using neurologic monitoring. There were no neurologic structures in the immediate vicinity of the dilator.  I then sequentially dilated and placed a self-retaining retractor, which was secured to the bed using a rigid arm.  The retractor was slightly opened.  Again, using triggered EMG, it was clear that there were no neurologic structures in the immediate vicinity of the  intervertebral disk space.  At this point, I performed an annulotomy and I did release the contralateral annulus.  I then performed a thorough and complete L3- 4 intervertebral diskectomy.  I then placed a series of trials.  I did ultimately elect to place an implant 10 mm in height.  The implant was liberally packed with DBX mix and was tamped into position.  Of note, I did note excellent restoration of the intervertebral disc height. The self-retaining retractor was then removed.  I then again entered through the psoas musculature, and docked the initial dilator over the L2-3 intervertebral space.  I then sequentially dilated and placed a self-retaining retractor.  Again, I did liberally use EMG testing, and there were no neurologic structures noted in the immediate vicinity of the retractor.  I then again performed annulotomy and then performed a thorough complete L2-3 intervertebral diskectomy.  Once again, the contralateral annulus was released.  I then placed a series of trials and I did ultimately elect to place a 10 mm implant, after packing it liberally with DBX mix.  I was very pleased with the press-fit of the implant.  The break in the bed was then undone and the bed was flattened.  At this point, the wound was copiously irrigated, and the wound was closed using #1 Vicryl, followed by 2-0 Vicryl, followed by 4-0 Monocryl.  Benzoin and Steri-Strips were then applied.  The patient was then placed prone on a flat Jackson bed with a spinal frame.  Once again, a timeout procedure was performed.  Fluoroscopy was then brought into the field.  The pedicles of L2, L3, and L4 were marked out.  Paramedian incisions were then made on the right and left sides, just lateral to the pedicles.  Jamshidi's were then advanced across the L2, L3, and L4 pedicles bilaterally.  On the left side, the L2-3 and L3-4 facet joints were identified and subperiosteally exposed and decorticated.  The remainder of the DBX  mix was packed into the posterior lateral gutters on the left across L2-3 and L3-4, to help aid in the success of the fusion.  At this point, I sequentially tapped over the Jamshidi's, up to a 6.8 mm tap.  7 x 45 mm screws were then advanced over the guidewires, after which point the guidewires were removed.  I then passed 75 mm rods subfascially bilaterally, which were secured into the tulip heads of the screws.  Caps were then placed and a final locking procedure was performed.  I was very pleased with the final AP and lateral fluoroscopic images.  At this point, the wounds were copiously irrigated. The wounds were then closed in layers using #1 Vicryl, followed by 2-0 Vicryl, followed by 4- 0 Monocryl.  Benzoin and Steri-Strips were applied followed by a sterile dressing.  All instrument counts were correct at the termination of the procedure.   Of note, Pricilla Holm, was my assistant throughout surgery, and did aid in retraction, placement of the hardware, suctioning, and closure from start to finish.  Phylliss Bob, MD

## 2020-03-12 NOTE — Transfer of Care (Signed)
Immediate Anesthesia Transfer of Care Note  Patient: Susan Berger  Procedure(s) Performed: LEFT ANTERIOR LATERAL INTERBODY FUSION LUMBAR 2-3 AND LUMBAR 3-4 (N/A Spine Lumbar) WITH POSTERIOR SPINAL FUSION WITH INSTRUMENTATION AND ALLOGRAFT (Spine Lumbar)  Patient Location: PACU  Anesthesia Type:General  Level of Consciousness: drowsy and patient cooperative  Airway & Oxygen Therapy: Patient Spontanous Breathing and Patient connected to face mask oxygen  Post-op Assessment: Report given to RN and Post -op Vital signs reviewed and stable  Post vital signs: Reviewed and stable  Last Vitals:  Vitals Value Taken Time  BP 153/88 03/12/20 1641  Temp    Pulse 88 03/12/20 1643  Resp    SpO2 96 % 03/12/20 1643  Vitals shown include unvalidated device data.  Last Pain:  Vitals:   03/12/20 0916  TempSrc:   PainSc: 7       Patients Stated Pain Goal: 3 ([id number])  Complications: No apparent anesthesia complications

## 2020-03-12 NOTE — Anesthesia Postprocedure Evaluation (Signed)
Anesthesia Post Note  Patient: Susan Berger  Procedure(s) Performed: LEFT ANTERIOR LATERAL INTERBODY FUSION LUMBAR 2-3 AND LUMBAR 3-4 (N/A Spine Lumbar) WITH POSTERIOR SPINAL FUSION WITH INSTRUMENTATION AND ALLOGRAFT (Spine Lumbar)     Patient location during evaluation: PACU Anesthesia Type: General Level of consciousness: awake and alert Pain management: pain level controlled Vital Signs Assessment: post-procedure vital signs reviewed and stable Respiratory status: spontaneous breathing, nonlabored ventilation, respiratory function stable and patient connected to nasal cannula oxygen Cardiovascular status: blood pressure returned to baseline and stable Postop Assessment: no apparent nausea or vomiting Anesthetic complications: no    Last Vitals:  Vitals:   03/12/20 1658 03/12/20 1711  BP: (!) 163/89 (!) 168/94  Pulse: (!) 103 99  Resp: (!) 21 16  Temp:    SpO2: 92% 93%    Last Pain:  Vitals:   03/12/20 1711  TempSrc:   PainSc: 8                  FITZGERALD,W. EDMOND

## 2020-03-12 NOTE — H&P (Signed)
PREOPERATIVE H&P  Chief Complaint: Left leg pain  HPI: Susan Berger is a 59 y.o. female who presents with ongoing pain in the left leg  MRI reveals stenosis, with increased neuroforaminal stenosis identified at L2-3 and L3-4, compared to a prior MRI.  Given the location of the patient's leg pain, I did feel that these findings did correlate to her pain.  She was noted to have a thoracolumbar scoliosis in addition to the MRI findings noted above.  She was also noted to have disc protrusions at other adjacent levels, with degenerative changes noted at other levels, but I did feel that the findings noted above did most closely correlate with her pain.  Patient has failed multiple forms of conservative care and continues to have pain (see office notes for additional details regarding the patient's full course of treatment)  Past Medical History:  Diagnosis Date  . Allergy   . Anxiety   . Borderline diabetes   . Constipation    uses magnesium daily and with this stools are sofe  . COPD (chronic obstructive pulmonary disease) ([place])   . Depression   . Diabetes mellitus   . History of kidney stones   . Hyperlipidemia   . Migraine   . Multiple allergies    allergy vac 3 days a week   Past Surgical History:  Procedure Laterality Date  . COLONOSCOPY    . KIDNEY SURGERY     kidney stones removed  . PARTIAL HYSTERECTOMY    . VESICOVAGINAL FISTULA CLOSURE W/ TAH     Social History   Socioeconomic History  . Marital status: Married    Spouse name: Not on file  . Number of children: Not on file  . Years of education: Not on file  . Highest education level: Not on file  Occupational History  . Not on file  Tobacco Use  . Smoking status: Former Smoker    Packs/day: 1.00    Years: 33.00    Pack years: 33.00    Types: Cigarettes    Quit date: 04/01/2011    Years since quitting: 8.9  . Smokeless tobacco: Never Used  Substance and Sexual Activity  . Alcohol use: Yes   Alcohol/week: 0.0 standard drinks    Comment: rare  . Drug use: No  . Sexual activity: Not on file  Other Topics Concern  . Not on file  Social History Narrative  . Not on file   Social Determinants of Health   Financial Resource Strain:   . Difficulty of Paying Living Expenses:   Food Insecurity:   . Worried About Charity fundraiser in the Last Year:   . Arboriculturist in the Last Year:   Transportation Needs:   . Film/video editor (Medical):   Marland Kitchen Lack of Transportation (Non-Medical):   Physical Activity:   . Days of Exercise per Week:   . Minutes of Exercise per Session:   Stress:   . Feeling of Stress :   Social Connections:   . Frequency of Communication with Friends and Family:   . Frequency of Social Gatherings with Friends and Family:   . Attends Religious Services:   . Active Member of Clubs or Organizations:   . Attends Archivist Meetings:   Marland Kitchen Marital Status:    Family History  Problem Relation Age of Onset  . Emphysema Mother   . COPD Mother   . Lung cancer Mother   . Asthma  Paternal Grandmother   . Lung cancer Paternal Grandfather   . Diabetes Other   . Colon cancer Neg Hx   . Colon polyps Neg Hx    No Known Allergies Prior to Admission medications   Medication Sig Start Date End Date Taking? Authorizing Provider  albuterol (PROAIR HFA) 108 (90 Base) MCG/ACT inhaler INHALE 2 PUFFS INTO THE LUNGS EVERY 6 HOURS AS NEEDED FOR WHEEZING. 02/17/20  Yes Martyn Ehrich, NP  atorvastatin (LIPITOR) 80 MG tablet Take 80 mg by mouth every evening.  03/28/19  Yes [provider]  CYMBALTA 60 MG capsule Take 60 mg by mouth every evening.  08/15/11  Yes [provider]  loratadine (CLARITIN) 10 MG tablet Take 10 mg by mouth daily as needed for allergies.    Yes [provider]  magnesium oxide (MAG-OX) 400 MG tablet Take 400 mg by mouth every evening.   Yes [provider]  meloxicam (MOBIC) 15 MG tablet Take 15 mg  by mouth every evening. 02/24/20  Yes [provider]  metFORMIN (GLUCOPHAGE) 500 MG tablet Take 500 mg by mouth in the morning and at bedtime.  08/30/11  Yes [provider]  NORCO 5-325 MG tablet Take 1 tablet by mouth every 8 (eight) hours as needed for pain. 02/11/20  Yes [provider]  Tiotropium Bromide-Olodaterol (STIOLTO RESPIMAT) 2.5-2.5 MCG/ACT AERS INHALE 2 PUFFS BY MOUTH INTO THE LUNGS DAILY Patient taking differently: Inhale 2 puffs into the lungs daily.  02/17/20  Yes Martyn Ehrich, NP     All other systems have been reviewed and were otherwise negative with the exception of those mentioned in the HPI and as above.  Physical Exam: There were no vitals filed for this visit.  There is no height or weight on file to calculate BMI.  General: Alert, no acute distress Cardiovascular: No pedal edema Respiratory: No cyanosis, no use of accessory musculature Skin: No lesions in the area of chief complaint Neurologic: Sensation intact distally Psychiatric: Patient is competent for consent with normal mood and affect Lymphatic: No axillary or cervical lymphadenopathy   Assessment/Plan: 1. LEFT-SIDED LUMBAR RADICULOPATHY DUE TO MRI FINDING NOTED ABOVE 2. [place] for Procedure(s): LEFT LATERAL INTERBODY FUSION LUMBAR 2-3 AND LUMBAR 3-4 WITH POSTERIOR SPINAL FUSION WITH INSTRUMENTATION AND ALLOGRAFT   Norva Karvonen, MD 03/12/2020 6:28 AM

## 2020-03-13 LAB — GLUCOSE, CAPILLARY: Glucose-Capillary: 101 mg/dL — ABNORMAL HIGH (ref 70–99)

## 2020-03-13 MED ORDER — METHOCARBAMOL 500 MG PO TABS: 500.0000 mg | ORAL_TABLET | Freq: Four times a day (QID) | ORAL | 1 refills | Status: AC | PRN

## 2020-03-13 MED ORDER — OXYCODONE-ACETAMINOPHEN 5-325 MG PO TABS: 1.0000 | ORAL_TABLET | ORAL | 0 refills | Status: DC | PRN

## 2020-03-13 MED FILL — Thrombin (Recombinant) For Soln 20000 Unit: CUTANEOUS | Qty: 1 | Status: AC

## 2020-03-13 NOTE — Progress Notes (Signed)
    Patient doing well Has been ambulating Patient reports resolution of her preop left leg pain   Physical Exam: Vitals:   03/12/20 2318 03/13/20 0323  BP: 117/60 121/68  Pulse: 85 82  Resp: 18 18  Temp: 98.9 F (37.2 C) 98.4 F (36.9 C)  SpO2: 90% 98%    Dressing in place NVI  POD #1 s/p L2-L4 lateral/posterior fusion, doing well  - up with PT/OT, encourage ambulation - Percocet for pain, Robaxin for muscle spasms - d/c home today with f/u in 2 weeks

## 2020-03-13 NOTE — Evaluation (Signed)
Physical Therapy Evaluation & Discharge Patient Details Name: Susan Berger MRN: [id number] DOB: 02/28/1961 Today's Date: 03/13/2020   History of Present Illness  Pt is a 59 year old woman admitted for L ALIF/PLIF L2-3, L3-4. PMH: COPD, HA, anxiety, DM.  Clinical Impression  Pt presented sitting EOB, awake and willing to participate in therapy session. Prior to admission, pt reported that she was independent with all functional mobility and ADLs. Pt lives with her husband and adult son with special needs in a single level home with a ramped entrance. At the time of evaluation, pt moving very well overall without need for physical assistance. She tolerated hallway ambulation without use of an AD with supervision for safety. PT provided pt education re: back precautions with handout provided, car transfers and a generalized walking program for pt to initiate upon d/c home. Pt expressed understanding. No further acute PT needs identified at this time. PT signing off.     Follow Up Recommendations No PT follow up    Equipment Recommendations  None recommended by PT    Recommendations for Other Services       Precautions / Restrictions Precautions Precautions: Back Precaution Booklet Issued: Yes (comment) Precaution Comments: reviewed back precautions and reinforced with written handout Required Braces or Orthoses: Spinal Brace Spinal Brace: Thoracolumbosacral orthotic;Applied in sitting position Restrictions Weight Bearing Restrictions: No      Mobility  Bed Mobility               General bed mobility comments: pt seated EOB upon arrival; reviewed log roll technique verbally with handout  Transfers Overall transfer level: Modified independent Equipment used: None             General transfer comment: slow to rise, heavily reliant on UEs  Ambulation/Gait Ambulation/Gait assistance: Supervision Gait Distance (Feet): 500 Feet Assistive device: None Gait  Pattern/deviations: Step-through pattern Gait velocity: decreased   General Gait Details: pt with slow, steady gait without use of an AD; no LOB or need for physical assistance, supervision for safety  Stairs            Wheelchair Mobility    Modified Rankin (Stroke Patients Only)       Balance Overall balance assessment: No apparent balance deficits (not formally assessed)                                           Pertinent Vitals/Pain Pain Assessment: Faces Faces Pain Scale: Hurts little more Pain Location: back Pain Descriptors / Indicators: Grimacing;Guarding;Sore Pain Intervention(s): Monitored during session;Repositioned    Home Living Family/patient expects to be discharged to:: Private residence Living Arrangements: Spouse/significant other;Children Available Help at Discharge: Family;Friend(s);Available 24 hours/day Type of Home: House Home Access: Ramped entrance     Home Layout: One level;Laundry or work area in [place]: [place] held shower head      Prior Function Level of Independence: Independent         Comments: cares for her son with special needs; has friends and husband to help her as she recovers     Hand Dominance   Dominant Hand: Right    Extremity/Trunk Assessment   Upper Extremity Assessment Upper Extremity Assessment: Defer to OT evaluation;Overall [facility] for tasks assessed    Lower Extremity Assessment Lower Extremity Assessment: Overall WFL for tasks assessed    Cervical / Trunk Assessment Cervical /  Trunk Assessment: Other exceptions Cervical / Trunk Exceptions: s/p lumbar sx  Communication   Communication: No difficulties  Cognition Arousal/Alertness: Awake/alert Behavior During Therapy: WFL for tasks assessed/performed Overall Cognitive Status: Within Functional Limits for tasks assessed                                        General Comments       Exercises     Assessment/Plan    PT Assessment Patent does not need any further PT services  PT Problem List         PT Treatment Interventions      PT Goals (Current goals can be found in the Care Plan section)  Acute Rehab PT Goals Patient Stated Goal: to return home PT Goal Formulation: All assessment and education complete, DC therapy    Frequency     Barriers to discharge        Co-evaluation               AM-PAC PT "6 Clicks" Mobility  Outcome Measure Help needed turning from your back to your side while in a flat bed without using bedrails?: None Help needed moving from lying on your back to sitting on the side of a flat bed without using bedrails?: None Help needed moving to and from a bed to a chair (including a wheelchair)?: None Help needed standing up from a chair using your arms (e.g., wheelchair or bedside chair)?: None Help needed to walk in hospital room?: None Help needed climbing 3-5 steps with a railing? : A Little 6 Click Score: 23    End of Session Equipment Utilized During Treatment: Back brace Activity Tolerance: Patient tolerated treatment well Patient left: in chair;with call bell/phone within reach Nurse Communication: Mobility status PT Visit Diagnosis: Other abnormalities of gait and mobility (R26.89)    Time: [id number] PT Time Calculation (min) (ACUTE ONLY): 15 min   Charges:   PT Evaluation $PT Eval Low Complexity: 1 Low          Eduard Clos, PT, DPT  Acute Rehabilitation Services Pager [phone] Office [place] 03/13/2020, 10:45 AM

## 2020-03-13 NOTE — Progress Notes (Signed)
Patient is discharged from room 3C02 at this time. Alert and in stable condition. IV site d/c'd and instructions read to patient and spouse with understanding verbalized and all questions answered. Left unit via wheelchair with all belongings at side.  ?

## 2020-03-13 NOTE — Evaluation (Signed)
Occupational Therapy Evaluation and Discharge Patient Details Name: Susan Berger MRN: [id number] DOB: 04-Nov-1960 Today's Date: 03/13/2020    History of Present Illness Pt is a 59 year old woman admitted for L ALIF/PLIF L2-3, L3-4. PMH: COPD, HA, anxiety, DM.   Clinical Impression   Pt is functioning modified independently in ADL. Educated at length in compensatory strategies for ADL adhering to back precautions and IADL to avoid. Pt has good support of her husband and 3 friends once she returns home. No further OT needs.    Follow Up Recommendations  No OT follow up    Equipment Recommendations  None recommended by OT    Recommendations for Other Services       Precautions / Restrictions Precautions Precautions: Back Precaution Booklet Issued: Yes (comment) Precaution Comments: reviewed back precautions and reinforced with written handout Required Braces or Orthoses: Spinal Brace Spinal Brace: Thoracolumbosacral orthotic;Applied in sitting position Restrictions Weight Bearing Restrictions: No      Mobility Bed Mobility               General bed mobility comments: received in chair  Transfers Overall transfer level: Modified independent Equipment used: None             General transfer comment: slow to rise, heavily reliant on UEs    Balance                                           ADL either performed or assessed with clinical judgement   ADL Overall ADL's : Modified independent                                       General ADL Comments: Educated in compensatory strategies for oral care, face washing, pericare and LB bathing and dressing. Educated in IADL to avoid.      Vision Baseline Vision/History: Wears glasses Wears Glasses: At all times Patient Visual Report: No change from baseline       Perception     Praxis      Pertinent Vitals/Pain Pain Assessment: Faces Faces Pain Scale: Hurts even  more Pain Location: back Pain Descriptors / Indicators: Grimacing;Guarding;Sore Pain Intervention(s): Monitored during session;Patient requesting pain meds-RN notified;Repositioned     Hand Dominance Right   Extremity/Trunk Assessment Upper Extremity Assessment Upper Extremity Assessment: Overall WFL for tasks assessed   Lower Extremity Assessment Lower Extremity Assessment: Defer to PT evaluation       Communication Communication Communication: No difficulties   Cognition Arousal/Alertness: Awake/alert Behavior During Therapy: WFL for tasks assessed/performed Overall Cognitive Status: Within Functional Limits for tasks assessed                                     General Comments       Exercises     Shoulder Instructions      Home Living Family/patient expects to be discharged to:: Private residence Living Arrangements: Spouse/significant other;Children Available Help at Discharge: Family;Friend(s);Available 24 hours/day Type of Home: House Home Access: Ramped entrance     Home Layout: One level;Laundry or work area in basement     [employer]: Occupational psychologist: [place]: Shower seat;Hand  held shower head          Prior Functioning/Environment Level of Independence: Independent        Comments: cares for her disabled son, had friends and husband to help her as she recovers        OT Problem List:        OT Treatment/Interventions:      OT Goals(Current goals can be found in the care plan section) Acute Rehab OT Goals Patient Stated Goal: to return home Potential to Achieve Goals: Good  OT Frequency:     Barriers to D/C:            Co-evaluation              AM-PAC OT "6 Clicks" Daily Activity     Outcome Measure Help from another person eating meals?: None Help from another person taking care of personal grooming?: None Help from another person toileting, which includes  using toliet, bedpan, or urinal?: None Help from another person bathing (including washing, rinsing, drying)?: None Help from another person to put on and taking off regular upper body clothing?: None Help from another person to put on and taking off regular lower body clothing?: None 6 Click Score: 24   End of Session Equipment Utilized During Treatment: Back brace Nurse Communication: Patient requests pain meds(ready to d/c)  Activity Tolerance: Patient tolerated treatment well Patient left: in chair;with call bell/phone within reach  OT Visit Diagnosis: Pain;Other abnormalities of gait and mobility (R26.89)                Time: [id number] OT Time Calculation (min): 17 min Charges:  OT General Charges $OT Visit: 1 Visit OT Evaluation $OT Eval Low Complexity: 1 Low  Nestor Lewandowsky, OTR/L Acute Rehabilitation Services Pager: [phone] Office: [phone]  Malka So 03/13/2020, 9:44 AM

## 2020-03-19 NOTE — Discharge Summary (Signed)
Patient ID: Susan Berger MRN: [id number] DOB/AGE: December 25, 1960 59 y.o.  Admit date: 03/12/2020 Discharge date: 03/13/2020  Admission Diagnoses:  Active Problems:   Radiculopathy   Discharge Diagnoses:  Same  Past Medical History:  Diagnosis Date  . Allergy   . Anxiety   . Borderline diabetes   . Constipation    uses magnesium daily and with this stools are sofe  . COPD (chronic obstructive pulmonary disease) ([place])   . Depression   . Diabetes mellitus   . History of kidney stones   . Hyperlipidemia   . Migraine   . Multiple allergies    allergy vac 3 days a week    Surgeries: Procedure(s): LEFT ANTERIOR LATERAL INTERBODY FUSION LUMBAR 2-3 AND LUMBAR 3-4 WITH POSTERIOR SPINAL FUSION WITH INSTRUMENTATION AND ALLOGRAFT on 03/12/2020   Consultants: none  Discharged Condition: Improved  Hospital Course: Susan Berger is an 59 y.o. female who was admitted 03/12/2020 for operative treatment of radiculopathy. Patient has severe unremitting pain that affects sleep, daily activities, and work/hobbies. After pre-op clearance the patient was taken to the operating room on 03/12/2020 and underwent  Procedure(s): LEFT ANTERIOR LATERAL INTERBODY FUSION LUMBAR 2-3 AND LUMBAR 3-4 WITH POSTERIOR SPINAL FUSION WITH INSTRUMENTATION AND ALLOGRAFT.    Patient was given perioperative antibiotics:  Anti-infectives (From admission, onward)   Start     Dose/Rate Route Frequency Ordered Stop   03/12/20 1815  ceFAZolin (ANCEF) IVPB 2g/100 mL premix     2 g 200 mL/hr over 30 Minutes Intravenous Every 8 hours 03/12/20 1814 03/13/20 0945   03/12/20 0900  ceFAZolin (ANCEF) IVPB 2g/100 mL premix     2 g 200 mL/hr over 30 Minutes Intravenous To [facility] 03/12/20 0855 03/12/20 1530       Patient was given sequential compression devices, early ambulation to prevent DVT.  Patient benefited maximally from hospital stay and there were no complications.    Recent vital signs: BP  121/68 (BP Location: Right Arm)   Pulse 82   Temp 98.4 F (36.9 C) (Oral)   Resp 18   Ht 5\' 5"  (1.651 m)   Wt 64.9 kg   SpO2 98%   BMI 23.80 kg/m    Discharge Medications:   Allergies as of 03/13/2020   No Known Allergies     Medication List    TAKE these medications   albuterol 108 (90 Base) MCG/ACT inhaler Commonly known as: ProAir HFA INHALE 2 PUFFS INTO THE LUNGS EVERY 6 HOURS AS NEEDED FOR WHEEZING.   atorvastatin 80 MG tablet Commonly known as: LIPITOR Take 80 mg by mouth every evening.   Cymbalta 60 MG capsule Generic drug: DULoxetine Take 60 mg by mouth every evening.   loratadine 10 MG tablet Commonly known as: CLARITIN Take 10 mg by mouth daily as needed for allergies.   magnesium oxide 400 MG tablet Commonly known as: MAG-OX Take 400 mg by mouth every evening.   metFORMIN 500 MG tablet Commonly known as: GLUCOPHAGE Take 500 mg by mouth in the morning and at bedtime.   methocarbamol 500 MG tablet Commonly known as: ROBAXIN Take 1 tablet (500 mg total) by mouth every 6 (six) hours as needed for muscle spasms.   Norco 5-325 MG tablet Generic drug: HYDROcodone-acetaminophen Take 1 tablet by mouth every 8 (eight) hours as needed for pain.   oxyCODONE-acetaminophen 5-325 MG tablet Commonly known as: PERCOCET/ROXICET Take 1-2 tablets by mouth every 4 (four) hours as needed for moderate pain or  severe pain.   Stiolto Respimat 2.5-2.5 MCG/ACT Aers Generic drug: Tiotropium Bromide-Olodaterol INHALE 2 PUFFS BY MOUTH INTO THE LUNGS DAILY What changed:   how much to take  how to take this  when to take this  additional instructions       Diagnostic Studies: DG Lumbar Spine 2-3 Views  Result Date: 03/12/2020 CLINICAL DATA:  Posterior fusion EXAM: LUMBAR SPINE - 2-3 VIEW COMPARISON:  07/29/2019 FINDINGS: Frontal lateral views of the lumbar spine demonstrate posterior fusion from L2-L4 with interbody spacers at the L2-L3 and L3-L4 levels. The  hardware appears intact. There are expected postsurgical changes. IMPRESSION: L2-L4 posterior fusion without complication. Electronically Signed   By: Constance Holster M.D.   On: 03/12/2020 19:32   CT Chest Wo Contrast  Result Date: 03/02/2020 CLINICAL DATA:  Abnormal chest x-ray. Abnormal x-ray, lung opacity/opacities. Additional history provided: Follow-up left apical nodular opacity on preop chest x-ray. EXAM: CT CHEST WITHOUT CONTRAST TECHNIQUE: Multidetector CT imaging of the chest was performed following the standard protocol without IV contrast. COMPARISON:  Chest radiograph 02/17/2020, chest CT 02/19/2018. FINDINGS: CARDIOVASCULAR: Normal heart size.No pericardial effusion. Mild coronary artery atherosclerotic calcifications. No evidence of thoracic aortic aneurysm. MEDIASTINUM/NODES: No enlarged mediastinal or axillary lymph nodes. Limited evaluation of the hila without intravenous contrast. Visualized thyroid gland unremarkable. LUNGS/PLEURA: No airspace consolidation. No pleural effusion or pneumothorax. No left apical lung nodule is demonstrated to account for the perceived abnormality on chest radiograph 02/17/2020. Elsewhere, there are stable small bilateral pulmonary nodules measuring up to 2 mm. Apical predominant centrilobular emphysema. Minimal linear atelectasis and/or scarring within the right lung base. The central airways are grossly patent. UPPER ABDOMEN: The visualized upper abdomen is grossly unremarkable. MUSCULOSKELETAL: No suspicious lytic or blastic osseous lesion. Cervical spondylosis with multilevel disc space narrowing and degenerative endplate irregularity. IMPRESSION: No left apical lung nodule is demonstrated to account for the perceived abnormality on chest radiograph 02/17/2020. Elsewhere, there are small bilateral lung nodules measuring up to 2 mm which are stable as compared to prior low-dose screening chest CT 02/19/2018. Continue annual screening with low-dose chest CT  without contrast in 12 months. Emphysema (ICD10-J43.9). Mild coronary artery calcifications. Electronically Signed   By: Kellie Simmering DO   On: 03/02/2020 10:53   DG C-Arm 1-60 Min  Result Date: 03/12/2020 CLINICAL DATA:  Intraoperative films for posterior fusion. EXAM: DG C-ARM 1-60 MIN FLUOROSCOPY TIME:  Fluoroscopy Time:  7 minutes and 50 seconds Number of Acquired Spot Images: 9 COMPARISON:  07/29/2019 FINDINGS: Frontal lateral views of the lumbar spine demonstrate posterior fusion from L2-L4 with interbody spacers at the L2-L3 and L3-L4 levels. The hardware appears intact. There are expected postsurgical changes. IMPRESSION: Posterior lumbar fusion L2-L4. Electronically Signed   By: Constance Holster M.D.   On: 03/12/2020 19:31    Disposition: Discharge disposition: 01-Home or Self Care        POD #1 s/p L2-L4 lateral/posterior fusion, doing well  - up with PT/OT, encourage ambulation - Percocet for pain, Robaxin for muscle spasms -Scripts for pain sent to pharmacy electronically  -D/C instructions sheet printed and in chart -D/C today  -F/U in office 2 weeks   Signed: Lennie Muckle Wai Litt 03/19/2020, 11:26 AM

## 2020-07-02 ENCOUNTER — Other Ambulatory Visit: Payer: Self-pay | Admitting: Emergency Medicine

## 2020-09-09 ENCOUNTER — Other Ambulatory Visit: Payer: Self-pay | Admitting: Primary Care

## 2020-12-15 ENCOUNTER — Other Ambulatory Visit: Payer: Self-pay | Admitting: Orthopedic Surgery

## 2020-12-17 ENCOUNTER — Telehealth: Payer: Self-pay | Admitting: Emergency Medicine

## 2020-12-22 ENCOUNTER — Encounter: Payer: Self-pay | Admitting: Primary Care

## 2020-12-22 ENCOUNTER — Other Ambulatory Visit: Payer: Self-pay

## 2020-12-22 ENCOUNTER — Ambulatory Visit (INDEPENDENT_AMBULATORY_CARE_PROVIDER_SITE_OTHER): Payer: 59 | Admitting: Primary Care

## 2020-12-22 DIAGNOSIS — Z01811 Encounter for preprocedural respiratory examination: Secondary | ICD-10-CM

## 2020-12-22 DIAGNOSIS — J449 Chronic obstructive pulmonary disease, unspecified: Secondary | ICD-10-CM | POA: Diagnosis not present

## 2020-12-22 DIAGNOSIS — R911 Solitary pulmonary nodule: Secondary | ICD-10-CM | POA: Diagnosis not present

## 2020-12-22 NOTE — Assessment & Plan Note (Signed)
-   CT chest 03/02/20 small bilateral lung nodules measuring 60mm which arte stable compared to 2019 LDCT - We will get in touch with lung cancer screening coordinator to get patient re-established with program

## 2020-12-22 NOTE — Assessment & Plan Note (Signed)
She is considered an intermediate risk for post-op pulmonary complications d/t nature of surgery and hx of COPD.   Recommend 1. Short duration of surgery as much as possible and avoid paralytic if possible 2. Recovery in step down or ICU with Pulmonary consultation if needed  3. DVT prophylaxis 4. Aggressive pulmonary toilet with o2, bronchodilatation, and incentive spirometry and early ambulation

## 2020-12-22 NOTE — Progress Notes (Signed)
@Patient  ID: Susan Berger, female    DOB: [birth year], 60 y.o.   MRN: [id number]  Chief Complaint  Patient presents with  . Follow-up    Pt is here for a surgical clearance visit. Pt is scheduled to have back surgery 02/03/21 by Dr. Lynann Bologna. Pt denies any complaints of cough, SOB, or CP.    Referring provider: Haywood Pao, MD  HPI: 60 year old female, former smoker quit 2012 (33 pack year hx). PMH significant COPD, allergic rhinitis, pulmonary nodule. Patient of Dr. Lamonte Sakai. Maintained on Stiolto, prn albuterol. Follow-up annually.   Previous [facility] pulmonary encounter: 02/17/2020 Patient presents today for surgical clearance. She is having posterior spinal fusion and left lateral interbody fusion of lumbar 2-3 and lumbar 3-4 with Dr. Phylliss Bob May 27th. She will be admitted to [facility] for one night observation.   Since last visit she has been doing well. No issues with breathing or recent COPD exacerbations. Continues using Stiolto. Uses her Ventolin in hte morning and 1-2 times a week during the day during allegy season. No shortness of breath. Occasional morning congestion. No dyspnea on exertion, chest tightness of wheezing. She is not on oxygen. She has had her first pfizer covid vaccine.   12/22/2020 Patient presents today for surgical clearance/COPD. She plans to have right sided lumbar 4-5 interbody fusion in April with Dr. Lynann Bologna. She will be admitted to [facility] for one night observation. She is doing well. No acute complaints. No recent exacerbation or hospitalizations. Breathing is stable. She is compliant with Stiolto respimat. She uses albuterol rescue inhaler once a week on average. During allergy season she finds that she needs to use it more at night. She is independent and not on oxygen. She rates her back pain 6/10 at rest. She takes tramadol once a day for pain. Denies active shortness of breath, cough, chest tightness, wheezing. She has had both pzifer COVID  vaccines but not booster.    Significant testing:  PFTs  09/05/2016- FVC 3.00 (78%), FEV1 1.93 (64%), ratio 64  Imaging: LDCT 02/19/18- Lung-RADS 2, benign appearance or behavior. Continue annual screening with low-dose chest CT without contrast in 12 months.  CT chest 03/02/20-  No left apical lung nodule. Small bilateral lung nodules measuring up to 2 mm which are stable as compared to LDCT 02/19/2018. Continue annual screening with low-dose chest CT without contrast in 12 months. Emphysema. Mild coronary artery calcifications.    No Known Allergies  Immunization History  Administered Date(s) Administered  . Influenza Split 07/23/2012, 07/17/2013  . Influenza,inj,Quad PF,6+ Mos 07/07/2015, 08/17/2016, 06/28/2017, 06/17/2020  . Influenza-Unspecified 07/09/2018  . Pneumococcal Conjugate-13 06/28/2017  . Pneumococcal Polysaccharide-23 09/05/2016    Past Medical History:  Diagnosis Date  . Allergy   . Anxiety   . Borderline diabetes   . Constipation    uses magnesium daily and with this stools are sofe  . COPD (chronic obstructive pulmonary disease) ([place])   . Depression   . Diabetes mellitus   . History of kidney stones   . Hyperlipidemia   . Migraine   . Multiple allergies    allergy vac 3 days a week    Tobacco History: Social History   Tobacco Use  Smoking Status Former Smoker  . Packs/day: 1.00  . Years: 33.00  . Pack years: 33.00  . Types: Cigarettes  . Start date: 60  . Quit date: 04/01/2011  . Years since quitting: 9.7  Smokeless Tobacco Never Used  Counseling given: Not Answered   Outpatient Medications Prior to Visit  Medication Sig Dispense Refill  . albuterol (VENTOLIN HFA) 108 (90 Base) MCG/ACT inhaler INHALE 2 PUFFS INTO THE LUNGS EVERY 6 HOURS AS NEEDED FOR WHEEZING 18 each 3  . atorvastatin (LIPITOR) 80 MG tablet Take 80 mg by mouth every evening.     . cyclobenzaprine (FLEXERIL) 5 MG tablet Take 5-10 mg by mouth at bedtime as needed.    .  CYMBALTA 60 MG capsule Take 60 mg by mouth every evening.     . loratadine (CLARITIN) 10 MG tablet Take 10 mg by mouth daily as needed for allergies.     . magnesium oxide (MAG-OX) 400 MG tablet Take 400 mg by mouth every evening.    . metFORMIN (GLUCOPHAGE) 500 MG tablet Take 500 mg by mouth in the morning and at bedtime.     . methocarbamol (ROBAXIN) 500 MG tablet Take 1 tablet (500 mg total) by mouth every 6 (six) hours as needed for muscle spasms. 30 tablet 1  . NEURONTIN 300 MG capsule Take 300 mg by mouth 3 (three) times daily.    Marland Kitchen STIOLTO RESPIMAT 2.5-2.5 MCG/ACT AERS INHALE 2 PUFFS BY MOUTH INTO THE LUNGS DAILY 4 g 5  . traMADol (ULTRAM) 50 MG tablet Take 50 mg by mouth 3 (three) times daily as needed.    . NORCO 5-325 MG tablet Take 1 tablet by mouth every 8 (eight) hours as needed for pain.    Marland Kitchen oxyCODONE-acetaminophen (PERCOCET/ROXICET) 5-325 MG tablet Take 1-2 tablets by mouth every 4 (four) hours as needed for moderate pain or severe pain. 30 tablet 0   Facility-Administered Medications Prior to Visit  Medication Dose Route Frequency Provider Last Rate Last Admin  . triamcinolone acetonide (KENALOG) 10 MG/ML injection 10 mg  10 mg Other Once Wallene Huh, DPM        Review of Systems  Review of Systems  Constitutional: Negative.   HENT: Negative.   Respiratory: Negative for cough, chest tightness, shortness of breath and wheezing.   Cardiovascular: Negative.   Musculoskeletal: Positive for back pain.    Physical Exam  BP 108/64 (BP Location: Left Arm, Cuff Size: Normal)   Pulse 80   Temp (!) 97 F (36.1 C) (Temporal)   Ht 5\' 5"  (1.651 m)   Wt 140 lb 9.6 oz (63.8 kg)   SpO2 97% Comment: RA  BMI 23.40 kg/m  Physical Exam Constitutional:      Appearance: Normal appearance.  Cardiovascular:     Rate and Rhythm: Normal rate and regular rhythm.  Pulmonary:     Effort: Pulmonary effort is normal.     Breath sounds: Normal breath sounds. No wheezing, rhonchi or  rales.  Skin:    General: Skin is warm and dry.  Neurological:     General: No focal deficit present.     Mental Status: She is alert and oriented to person, place, and time. Mental status is at baseline.  Psychiatric:        Mood and Affect: Mood normal.        Behavior: Behavior normal.        Thought Content: Thought content normal.        Judgment: Judgment normal.      Lab Results:  CBC    Component Value Date/Time   WBC 7.4 03/11/2020 1122   RBC 4.53 03/11/2020 1122   HGB 13.9 03/11/2020 1122   HCT 43.4 03/11/2020 1122  PLT 307 03/11/2020 1122   MCV 95.8 03/11/2020 1122   MCH 30.7 03/11/2020 1122   MCHC 32.0 03/11/2020 1122   RDW 13.8 03/11/2020 1122   LYMPHSABS 1.5 03/11/2020 1122   MONOABS 0.3 03/11/2020 1122   EOSABS 0.2 03/11/2020 1122   BASOSABS 0.0 03/11/2020 1122    BMET    Component Value Date/Time   NA 140 03/11/2020 1122   K 3.9 03/11/2020 1122   CL 105 03/11/2020 1122   CO2 26 03/11/2020 1122   GLUCOSE 101 (H) 03/11/2020 1122   BUN 13 03/11/2020 1122   CREATININE 0.66 03/11/2020 1122   CALCIUM 9.7 03/11/2020 1122   GFRNONAA >60 03/11/2020 1122   GFRAA >60 03/11/2020 1122    BNP No results found for: BNP  ProBNP No results found for: PROBNP  Imaging: No results found.   Assessment & Plan:   COPD (chronic obstructive pulmonary disease) - Stable interval. NO recent exacerbations or hospitalizations. Rare SABA use, <1x/week. Lungs clear on exam. O2 saturation 97% RA.  - Continue Stiolto Respimat 2 puffs once daily; prn albuterol hfa 2 puffs every 4-6 hours for sob/wheezing - FU in 1 year with Dr. Lamonte Sakai or sooner if needed   Pre-operative respiratory examination She is considered an intermediate risk for post-op pulmonary complications d/t nature of surgery and hx of COPD.   Recommend 1. Short duration of surgery as much as possible and avoid paralytic if possible 2. Recovery in step down or ICU with Pulmonary consultation if needed   3. DVT prophylaxis 4. Aggressive pulmonary toilet with o2, bronchodilatation, and incentive spirometry and early ambulation  Pulmonary nodule, right - CT chest 03/02/20 small bilateral lung nodules measuring 75mm which arte stable compared to 2019 LDCT - We will get in touch with lung cancer screening coordinator to get patient re-established with program    1) [place] - > 48h  1A) Arozullah - Prolonged mech ventilation risk Arozullah Postperative Pulmonary Risk Score - for mech ventilation dependence >48h [employer], [place] [address], major non-cardiac surgery) Comment Score  Type of surgery - abd ao aneurysm (27), thoracic (21), neurosurgery / upper abdominal / vascular (21), neck (11) Spinal 21  Emergency Surgery - (11)  0  ALbumin < 3 or poor nutritional state - (9)  0  BUN > 30 -  (8)  0  Partial or completely dependent functional status - (7)  0  COPD -  (6)  6  Age - 60 to 69 (4), > 70  (6)  0  TOTAL  27  Risk Stratifcation scores  - < 10 (0.5%), 11-19 (1.8%), 20-27 (4.2%), 28-40 (10.1%), >40 (26.6%)  4.2% risk      1B) GUPTA - Prolonged Mech Vent Risk Score source Risk  Guptal post op prolonged mech ventilation > 48h or reintubation < 30 days - ACS 2007-2008 dataset - http://lewis-perez.info/ 0.4 % Risk of mechanical ventilation for >48 hrs after surgery, or unplanned intubation ?30 days of surgery    2) RISK FOR POST OP PNEUMONIA Score source Risk  Lyndel Safe - Post Op Pnemounia risk  [employer][email or web address] 0.3 % Risk of postoperative pneumonia     R3) ISK FOR ANY POST-OP PULMONARY COMPLICATION Score source Risk  CANET/ARISCAT Score - risk for ANY/ALl pulmonary complications - > risk of in-hospital post-op pulmonary complications (composite including respiratory failure, respiratory infection, pleural effusion, atelectasis, pneumothorax, bronchospasm,  aspiration pneumonitis) [email or web address] - based on age, anemia, pulse ox, resp infection prior 30d, incision site,  duration of surgery, and emergency v elective surgery 43 points ARISCAT Score  Intermediate risk 13.3% risk of in-hospital post-op pulmonary complications (composite including respiratory failure, respiratory infection, pleural effusion, atelectasis, pneumothorax, bronchospasm, aspiration pneumonitis)     Martyn Ehrich, NP 12/22/2020

## 2020-12-22 NOTE — Patient Instructions (Addendum)
Recommendations:  - Continue Stiolto 2 puff once daily in morning (we will send refill into pharmacy) - Continue Loratadine 10mg  daily for allergies  - Use albuterol 2 puff every 4-6 hours as needed for breakthrough shortness of breath/wheezing - I will contact lung cancer screening program to get you set up for follow-up in May 2022  - If you develop cough with mucus production, shortness of breath or wheezing please notify our office   You are considered an intermediate risk for post-op pulmonary complications d/t nature of surgery and hx of COPD.   Recommend 1. Short duration of surgery as much as possible and avoid paralytic if possible 2. Recovery in step down or ICU with Pulmonary consultation if needed  3. DVT prophylaxis 4. Aggressive pulmonary toilet with o2, bronchodilatation, and incentive spirometry and early ambulation  Follow-up: 1 year with Dr. Lamonte Sakai or sooner if needed     Chronic Obstructive Pulmonary Disease  Chronic obstructive pulmonary disease (COPD) is a long-term (chronic) lung problem. When you have COPD, it is hard for air to get in and out of your lungs. Usually the condition gets worse over time, and your lungs will never return to normal. There are things you can do to keep yourself as healthy as possible. What are the causes?  Smoking. This is the most common cause.  Certain genes passed from parent to child (inherited). What increases the risk?  Being exposed to secondhand smoke from cigarettes, pipes, or cigars.  Being exposed to chemicals and other irritants, such as fumes and dust in the work environment.  Having chronic lung conditions or infections. What are the signs or symptoms?  Shortness of breath, especially during physical activity.  A long-term cough with a large amount of thick mucus. Sometimes, the cough may not have any mucus (dry cough).  Wheezing.  Breathing quickly.  Skin that looks gray or blue, especially in the  fingers, toes, or lips.  Feeling tired (fatigue).  Weight loss.  Chest tightness.  Having infections often.  Episodes when breathing symptoms become much worse (exacerbations). At the later stages of this disease, you may have swelling in the ankles, feet, or legs. How is this treated?  Taking medicines.  Quitting smoking, if you smoke.  Rehabilitation. This includes steps to make your body work better. It may involve a team of specialists.  Doing exercises.  Making changes to your diet.  Using oxygen.  Lung surgery.  Lung transplant.  Comfort measures (palliative care). Follow these instructions at home: Medicines  Take over-the-counter and prescription medicines only as told by your doctor.  Talk to your doctor before taking any cough or allergy medicines. You may need to avoid medicines that cause your lungs to be dry. Lifestyle  If you smoke, stop smoking. Smoking makes the problem worse.  Do not smoke or use any products that contain nicotine or tobacco. If you need help quitting, ask your doctor.  Avoid being around things that make your breathing worse. This may include smoke, chemicals, and fumes.  Stay active, but remember to rest as well.  Learn and use tips on how to manage stress and control your breathing.  Make sure you get enough sleep. Most adults need at least 7 hours of sleep every night.  Eat healthy foods. Eat smaller meals more often. Rest before meals. Controlled breathing Learn and use tips on how to control your breathing as told by your doctor. Try:  Breathing in (inhaling) through your nose for 1  second. Then, pucker your lips and breath out (exhale) through your lips for 2 seconds.  Putting one hand on your belly (abdomen). Breathe in slowly through your nose for 1 second. Your hand on your belly should move out. Pucker your lips and breathe out slowly through your lips. Your hand on your belly should move in as you breathe out.    Controlled coughing Learn and use controlled coughing to clear mucus from your lungs. Follow these steps: 1. Lean your head a little forward. 2. Breathe in deeply. 3. Try to hold your breath for 3 seconds. 4. Keep your mouth slightly open while coughing 2 times. 5. Spit any mucus out into a tissue. 6. Rest and do the steps again 1 or 2 times as needed. General instructions  Make sure you get all the shots (vaccines) that your doctor recommends. Ask your doctor about a flu shot and a pneumonia shot.  Use oxygen therapy and pulmonary rehabilitation if told by your doctor. If you need home oxygen therapy, ask your doctor if you should buy a tool to measure your oxygen level (oximeter).  Make a COPD action plan with your doctor. This helps you to know what to do if you feel worse than usual.  Manage any other conditions you have as told by your doctor.  Avoid going outside when it is very hot, cold, or humid.  Avoid people who have a sickness you can catch (contagious).  Keep all follow-up visits. Contact a doctor if:  You cough up more mucus than usual.  There is a change in the color or thickness of the mucus.  It is harder to breathe than usual.  Your breathing is faster than usual.  You have trouble sleeping.  You need to use your medicines more often than usual.  You have trouble doing your normal activities such as getting dressed or walking around the house. Get help right away if:  You have shortness of breath while resting.  You have shortness of breath that stops you from: ? Being able to talk. ? Doing normal activities.  Your chest hurts for longer than 5 minutes.  Your skin color is more blue than usual.  Your pulse oximeter shows that you have low oxygen for longer than 5 minutes.  You have a fever.  You feel too tired to breathe normally. These symptoms may represent a serious problem that is an emergency. Do not wait to see if the symptoms will go  away. Get medical help right away. Call your local emergency services (911 in the [place] Do not drive yourself to the hospital. Summary  Chronic obstructive pulmonary disease (COPD) is a long-term lung problem.  The way your lungs work will never return to normal. Usually the condition gets worse over time. There are things you can do to keep yourself as healthy as possible.  Take over-the-counter and prescription medicines only as told by your doctor.  If you smoke, stop. Smoking makes the problem worse. This information is not intended to replace advice given to you by your health care provider. Make sure you discuss any questions you have with your health care provider. Document Revised: 08/11/2020 Document Reviewed: 08/11/2020 Elsevier Patient Education  2021 [employer].

## 2020-12-22 NOTE — Assessment & Plan Note (Addendum)
-   Stable interval. NO recent exacerbations or hospitalizations. Rare SABA use, <1x/week. Lungs clear on exam. O2 saturation 97% RA.  - Continue Stiolto Respimat 2 puffs once daily; prn albuterol hfa 2 puffs every 4-6 hours for sob/wheezing - FU in 1 year with Dr. Lamonte Sakai or sooner if needed

## 2020-12-23 NOTE — Telephone Encounter (Signed)
Patient seen by Derl Barrow, NP on 12/22/2020-pr

## 2020-12-24 ENCOUNTER — Other Ambulatory Visit: Payer: Self-pay | Admitting: *Deleted

## 2020-12-24 DIAGNOSIS — Z87891 Personal history of nicotine dependence: Secondary | ICD-10-CM

## 2021-01-04 ENCOUNTER — Other Ambulatory Visit: Payer: Self-pay | Admitting: Orthopedic Surgery

## 2021-01-04 DIAGNOSIS — M545 Low back pain, unspecified: Secondary | ICD-10-CM

## 2021-01-16 ENCOUNTER — Ambulatory Visit
Admission: RE | Admit: 2021-01-16 | Discharge: 2021-01-16 | Disposition: A | Discharge status: Home / Self Care | Payer: 59 | Source: Ambulatory Visit | Attending: Orthopedic Surgery | Admitting: Orthopedic Surgery

## 2021-01-16 ENCOUNTER — Other Ambulatory Visit: Payer: Self-pay

## 2021-01-16 DIAGNOSIS — M545 Low back pain, unspecified: Secondary | ICD-10-CM

## 2021-01-22 ENCOUNTER — Other Ambulatory Visit: Payer: Self-pay | Admitting: Emergency Medicine

## 2021-01-22 ENCOUNTER — Other Ambulatory Visit: Payer: Self-pay

## 2021-01-29 NOTE — Pre-Procedure Instructions (Signed)
NYSSA SAYEGH  01/29/2021      Your procedure is scheduled on Wednesday, April 20.  Report to [facility], Main Entrance or Entrance "A" at 6:30 AM                Your surgery or procedure is scheduled to begin at   Call this number if you have problems the morning of surgery: [phone]  This is the number for the Pre- Surgical Desk.                For any other questions, please call [phone], Monday - Friday 8 AM - 4 PM.   Remember:  Do not eat  after midnight Tuesday, April 19..  You may drink clear liquids until 5:30 AM .   Clear liquids allowed are:                   Water, Juice (non-citric and without pulp - diabetics please choose diet or no sugar options), Carbonated beverages - (diabetics please choose diet or no sugar options), Clear Tea, Black Coffee only (no creamer, milk or cream including half and half), Plain Jell-O only (diabetics please choose diet or no sugar options), Gatorade (diabetics please choose diet or no sugar options) and Plain Popsicles only  Drink the 10 ounce bottle of water you were given in PAT appointment between 5:00 AM and 5:30 AM, then do not drink anything else.   Take these medicines the morning of surgery with A SIP OF WATER : NEURONTIN  STIOLTO RESPIMAT  methocarbamol (ROBAXIN)  If needed: Use albuterol (VENTOLIN HFA)  Inhaler, bring it to the hospital with you. loratadine (CLARITIN)  traMADol (ULTRAM)  STOP taking Aspirin, Aspirin Products (Goody Powder, Excedrin Migraine), Ibuprofen (Advil), Naproxen (Aleve), Vitamins and Herbal Products (ie Fish Oil)   WHAT DO I DO ABOUT MY DIABETES MEDICATION?  Marland Kitchen Do not take oral diabetes medicines (pills) the morning of surgery.metFORMIN (GLUCOPHAGE)   How to Manage Your Diabetes Before and After Surgery  Why is it important to control my blood sugar before and after surgery? . Improving blood sugar levels before and after surgery helps healing and can limit  problems. . A way of improving blood sugar control is eating a healthy diet by: o  Eating less sugar and carbohydrates o  Increasing activity/exercise o  Talking with your doctor about reaching your blood sugar goals . High blood sugars (greater than 180 mg/dL) can raise your risk of infections and slow your recovery, so you will need to focus on controlling your diabetes during the weeks before surgery. . Make sure that the doctor who takes care of your diabetes knows about your planned surgery including the date and location.  How do I manage my blood sugar before surgery? . Check your blood sugar at least 4 times a day, starting 2 days before surgery, to make sure that the level is not too high or low. o Check your blood sugar the morning of your surgery when you wake up and every 2 hours until you get to the Short Stay unit. . If your blood sugar is less than 70 mg/dL, you will need to treat for low blood sugar: o Do not take insulin. o Treat a low blood sugar (less than 70 mg/dL) with  cup of clear juice (cranberry or apple), 4 glucose tablets, OR glucose gel. Recheck blood sugar in 15 minutes after treatment (to make sure it is greater than 70  mg/dL). If your blood sugar is not greater than 70 mg/dL on recheck, call [phone] o  for further instructions. . Report your blood sugar to the short stay nurse when you get to Short Stay.  . If you are admitted to the hospital after surgery: o Your blood sugar will be checked by the staff and you will probably be given insulin after surgery (instead of oral diabetes medicines) to make sure you have good blood sugar levels. o The goal for blood sugar control after surgery is 80-180 mg/dL.    Special instructions:    [facility]- Preparing For Surgery  Before surgery, you can play an important role. Because skin is not sterile, your skin needs to be as free of germs as possible. You can reduce the number of germs on your skin by washing  with CHG (chlorahexidine gluconate) Soap before surgery.  CHG is an antiseptic cleaner which kills germs and bonds with the skin to continue killing germs even after washing.    Oral Hygiene is also important to reduce your risk of infection.  Remember - BRUSH YOUR TEETH THE MORNING OF SURGERY WITH YOUR REGULAR TOOTHPASTE  Please do not use if you have an allergy to CHG or antibacterial soaps. If your skin becomes reddened/irritated stop using the CHG.  Do not shave (including legs and underarms) for at least 48 hours prior to first CHG shower. It is OK to shave your face.  Please follow these instructions carefully.   1. Shower the NIGHT BEFORE SURGERY and the MORNING OF SURGERY with CHG.   2. If you chose to wash your hair, wash your hair first as usual with your normal shampoo.  3. After you shampoo, wash your face and private area with the soap you use at home, then rinse your hair and body thoroughly to remove the shampoo and soap.  4. Use CHG as you would any other liquid soap. You can apply CHG directly to the skin and wash gently with a scrungie or a clean washcloth.   5. Apply the CHG Soap to your body ONLY FROM THE NECK DOWN.  Do not use on open wounds or open sores. Avoid contact with your eyes, ears, mouth and genitals (private parts).   6. Wash thoroughly, paying special attention to the area where your surgery will be performed.  7. Thoroughly rinse your body with warm water from the neck down.  8. DO NOT shower/wash with your normal soap after using and rinsing off the CHG Soap.  9. Pat yourself dry with a CLEAN TOWEL.  10. Wear CLEAN PAJAMAS to bed the night before surgery, wear comfortable clothes the morning of surgery  11. Place CLEAN SHEETS on your bed the night of your first shower and DO NOT SLEEP WITH PETS.  Day of Surgery: Shower as instructed above. Do not apply any deodorants/lotions, powders or colognes.  Please wear clean clothes to the hospital/surgery  center.   Remember to brush your teeth WITH YOUR REGULAR TOOTHPASTE.  Do not wear jewelry, make-up or nail polish.  Do not shave 48 hours prior to surgery.  Men may shave face and neck.  Do not bring valuables to the hospital.  [facility] is not responsible for any belongings or valuables.  Contacts, dentures or bridgework may not be worn into surgery.  Leave your suitcase in the car.  After surgery it may be brought to your room.  For patients admitted to the hospital, discharge time will be determined  by your treatment team.  Patients discharged the day of surgery will not be allowed to drive home.   Please read over the fact sheets that you were given.

## 2021-01-29 NOTE — Progress Notes (Addendum)
PCP - Dr. Domenick Gong  Cardiologist - no  Pulmonologist : Susan Berger,  Saw 12/22/20 anad cleared patient for surgery.  Chest x-ray - 02/18/20  EKG - 03/11/20  Stress Test - no  ECHO - no Cardiac Cath -   AICD-noPM-noLOOP-no  Sleep Study - no CPAP - no  LABS-CBC with diff, CMP, PT, PTT, T/S UA, PCR, Covid.  ASA-no  ERAS-yes  HA1C- needs to be collected on DOS Fasting Blood Sugar - 110-115 Checks Blood Sugar __2___ times a day  Anesthesia-  Pt denies having chest pain, sob, or fever at this time. All instructions explained to the pt, with a verbal understanding of the material. Pt agrees to go over the instructions while at home for a better understanding. Pt also instructed to self quarantine after being tested for COVID-19. The opportunity to ask questions was provided.

## 2021-02-01 ENCOUNTER — Encounter (HOSPITAL_COMMUNITY): Payer: Self-pay

## 2021-02-01 ENCOUNTER — Encounter (HOSPITAL_COMMUNITY)
Admission: RE | Admit: 2021-02-01 | Discharge: 2021-02-01 | Disposition: A | Discharge status: Home / Self Care | Payer: 59 | Source: Ambulatory Visit | Attending: Orthopedic Surgery | Admitting: Orthopedic Surgery

## 2021-02-01 ENCOUNTER — Other Ambulatory Visit: Payer: Self-pay

## 2021-02-01 DIAGNOSIS — Z01812 Encounter for preprocedural laboratory examination: Secondary | ICD-10-CM | POA: Insufficient documentation

## 2021-02-01 DIAGNOSIS — Z20822 Contact with and (suspected) exposure to covid-19: Secondary | ICD-10-CM | POA: Insufficient documentation

## 2021-02-01 HISTORY — DX: Unspecified osteoarthritis, unspecified site: M19.90

## 2021-02-01 LAB — COMPREHENSIVE METABOLIC PANEL
ALT: 30 U/L (ref 0–44)
AST: 25 U/L (ref 15–41)
Albumin: 3.9 g/dL (ref 3.5–5.0)
Alkaline Phosphatase: 60 U/L (ref 38–126)
Anion gap: 9 (ref 5–15)
BUN: 11 mg/dL (ref 6–20)
CO2: 26 mmol/L (ref 22–32)
Calcium: 9.4 mg/dL (ref 8.9–10.3)
Chloride: 104 mmol/L (ref 98–111)
Creatinine, Ser: 0.67 mg/dL (ref 0.44–1.00)
GFR, Estimated: >60 mL/min (ref >60)
Glucose, Bld: 97 mg/dL (ref 70–99)
Potassium: 4 mmol/L (ref 3.5–5.1)
Sodium: 139 mmol/L (ref 135–145)
Total Bilirubin: 0.6 mg/dL (ref 0.3–1.2)
Total Protein: 6.2 g/dL — ABNORMAL LOW (ref 6.5–8.1)

## 2021-02-01 LAB — SURGICAL PCR SCREEN
MRSA, PCR: NEGATIVE
Staphylococcus aureus: NEGATIVE

## 2021-02-01 LAB — TYPE AND SCREEN
ABO/RH(D): A POS
Antibody Screen: NEGATIVE

## 2021-02-01 LAB — CBC WITH DIFFERENTIAL/PLATELET
Abs Immature Granulocytes: 0.02 10*3/uL (ref 0.00–0.07)
Basophils Absolute: 0 10*3/uL (ref 0.0–0.1)
Basophils Relative: 1 %
Eosinophils Absolute: 0.2 10*3/uL (ref 0.0–0.5)
Eosinophils Relative: 3 %
HCT: 40.5 % (ref 36.0–46.0)
Hemoglobin: 13.3 g/dL (ref 12.0–15.0)
Immature Granulocytes: 0 %
Lymphocytes Relative: 18 %
Lymphs Abs: 1.1 10*3/uL (ref 0.7–4.0)
MCH: 31.3 pg (ref 26.0–34.0)
MCHC: 32.8 g/dL (ref 30.0–36.0)
MCV: 95.3 fL (ref 80.0–100.0)
Monocytes Absolute: 0.3 10*3/uL (ref 0.1–1.0)
Monocytes Relative: 4 %
Neutro Abs: 4.4 10*3/uL (ref 1.7–7.7)
Neutrophils Relative %: 74 %
Platelets: 268 10*3/uL (ref 150–400)
RBC: 4.25 MIL/uL (ref 3.87–5.11)
RDW: 13.8 % (ref 11.5–15.5)
WBC: 5.9 10*3/uL (ref 4.0–10.5)
nRBC: 0 % (ref 0.0–0.2)

## 2021-02-01 LAB — URINALYSIS, ROUTINE W REFLEX MICROSCOPIC
Glucose, UA: NEGATIVE mg/dL
Hgb urine dipstick: NEGATIVE
Ketones, ur: 5 mg/dL — AB
Leukocytes,Ua: NEGATIVE
Nitrite: NEGATIVE
Protein, ur: NEGATIVE mg/dL
Specific Gravity, Urine: 1.023 (ref 1.005–1.030)
pH: 6 (ref 5.0–8.0)

## 2021-02-01 LAB — PROTIME-INR
INR: 0.9 (ref 0.8–1.2)
Prothrombin Time: 11.9 seconds (ref 11.4–15.2)

## 2021-02-01 LAB — GLUCOSE, CAPILLARY: Glucose-Capillary: 100 mg/dL — ABNORMAL HIGH (ref 70–99)

## 2021-02-01 LAB — SARS CORONAVIRUS 2 (TAT 6-24 HRS): SARS Coronavirus 2: NEGATIVE

## 2021-02-01 LAB — APTT: aPTT: 31 seconds (ref 24–36)

## 2021-02-01 NOTE — Progress Notes (Signed)
I spoke with Susan Berger, Dr. Lovell Sheehan scheduler to ask if surgery for 4/20 consent correct in Right side approach, for left side problem, Susan Berger said that is how it is ordered a a lot of times.  I asked Susan Berger to clarify surgical consents for 02/04/21, I explained that there are 3 set of order 2 for surgery on 02/04/21 and  3 sets of labs on all procedures, Susan Berger was with a patient and will have to check it later.

## 2021-02-02 LAB — GLUCOSE, CAPILLARY: Glucose-Capillary: 25 mg/dL — CL (ref 70–99)

## 2021-02-02 NOTE — Anesthesia Preprocedure Evaluation (Addendum)
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 WITH PROMINENT SEGMENTATL COLLAPSE ON THE LEFT 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  Neuromuscular disease    GI/Hepatic negative GI ROS, Neg liver ROS,   Endo/Other  diabetes, Type 2, Oral Hypoglycemic Agents  Renal/GU negative Renal ROS     Musculoskeletal  (+) Arthritis ,   Abdominal   Peds  Hematology negative hematology ROS (+)   Anesthesia Other Findings   Reproductive/Obstetrics                            Anesthesia Physical Anesthesia Plan  ASA: III  Anesthesia Plan: General   Post-op Pain Management:    Induction: Intravenous  PONV Risk Score and Plan: 3 and Midazolam, Dexamethasone and Ondansetron  Airway Management Planned: Oral ETT  Additional Equipment:   Intra-op Plan:   Post-operative Plan: Extubation in OR  Informed Consent: I have reviewed the patients History and Physical, chart, labs and discussed the procedure including the risks, benefits and alternatives for the proposed anesthesia with the patient or authorized representative who has indicated his/her understanding and acceptance.     Dental advisory given  Plan Discussed with: CRNA  Anesthesia Plan Comments: (2nd PIV after induction.)        Anesthesia Quick Evaluation

## 2021-02-02 NOTE — Progress Notes (Addendum)
Susan Berger reviewed labs, urine showed moderate bilirubin - liver function labs wnl.   Labs results sent to Dr. Loren Racer office.

## 2021-02-03 ENCOUNTER — Inpatient Hospital Stay (HOSPITAL_COMMUNITY): Payer: 59 | Admitting: Certified Registered Nurse Anesthetist

## 2021-02-03 ENCOUNTER — Inpatient Hospital Stay (HOSPITAL_COMMUNITY): Payer: 59

## 2021-02-03 ENCOUNTER — Inpatient Hospital Stay (HOSPITAL_COMMUNITY)
Admission: RE | Admit: 2021-02-03 | Discharge: 2021-02-05 | DRG: 455 | Disposition: A | Discharge status: Home / Self Care | Payer: 59 | Attending: Orthopedic Surgery | Admitting: Orthopedic Surgery

## 2021-02-03 ENCOUNTER — Other Ambulatory Visit: Payer: Self-pay

## 2021-02-03 ENCOUNTER — Inpatient Hospital Stay (HOSPITAL_COMMUNITY): Payer: 59 | Admitting: Physician Assistant

## 2021-02-03 ENCOUNTER — Inpatient Hospital Stay (HOSPITAL_COMMUNITY)
Admission: RE | Disposition: A | Discharge status: Home / Self Care | Payer: Self-pay | Source: Home / Self Care | Attending: Orthopedic Surgery

## 2021-02-03 ENCOUNTER — Encounter (HOSPITAL_COMMUNITY): Payer: Self-pay | Admitting: Orthopedic Surgery

## 2021-02-03 DIAGNOSIS — M5416 Radiculopathy, lumbar region: Principal | ICD-10-CM | POA: Diagnosis present

## 2021-02-03 DIAGNOSIS — Z825 Family history of asthma and other chronic lower respiratory diseases: Secondary | ICD-10-CM

## 2021-02-03 DIAGNOSIS — E785 Hyperlipidemia, unspecified: Secondary | ICD-10-CM | POA: Diagnosis present

## 2021-02-03 DIAGNOSIS — Z7984 Long term (current) use of oral hypoglycemic drugs: Secondary | ICD-10-CM | POA: Diagnosis not present

## 2021-02-03 DIAGNOSIS — F419 Anxiety disorder, unspecified: Secondary | ICD-10-CM | POA: Diagnosis present

## 2021-02-03 DIAGNOSIS — Z419 Encounter for procedure for purposes other than remedying health state, unspecified: Secondary | ICD-10-CM

## 2021-02-03 DIAGNOSIS — M5410 Radiculopathy, site unspecified: Secondary | ICD-10-CM | POA: Diagnosis present

## 2021-02-03 DIAGNOSIS — Z20822 Contact with and (suspected) exposure to covid-19: Secondary | ICD-10-CM | POA: Diagnosis present

## 2021-02-03 DIAGNOSIS — Z833 Family history of diabetes mellitus: Secondary | ICD-10-CM | POA: Diagnosis not present

## 2021-02-03 DIAGNOSIS — E119 Type 2 diabetes mellitus without complications: Secondary | ICD-10-CM | POA: Diagnosis present

## 2021-02-03 DIAGNOSIS — Z87891 Personal history of nicotine dependence: Secondary | ICD-10-CM | POA: Diagnosis not present

## 2021-02-03 DIAGNOSIS — Z79899 Other long term (current) drug therapy: Secondary | ICD-10-CM | POA: Diagnosis not present

## 2021-02-03 DIAGNOSIS — M48061 Spinal stenosis, lumbar region without neurogenic claudication: Secondary | ICD-10-CM | POA: Diagnosis present

## 2021-02-03 DIAGNOSIS — Z981 Arthrodesis status: Secondary | ICD-10-CM | POA: Diagnosis not present

## 2021-02-03 HISTORY — PX: ANTERIOR LATERAL LUMBAR FUSION WITH PERCUTANEOUS SCREW 1 LEVEL: SHX5553

## 2021-02-03 LAB — GLUCOSE, CAPILLARY
Glucose-Capillary: 108 mg/dL — ABNORMAL HIGH (ref 70–99)
Glucose-Capillary: 114 mg/dL — ABNORMAL HIGH (ref 70–99)
Glucose-Capillary: 120 mg/dL — ABNORMAL HIGH (ref 70–99)
Glucose-Capillary: 122 mg/dL — ABNORMAL HIGH (ref 70–99)
Glucose-Capillary: 129 mg/dL — ABNORMAL HIGH (ref 70–99)

## 2021-02-03 LAB — HEMOGLOBIN A1C
Hgb A1c MFr Bld: 6 % — ABNORMAL HIGH (ref 4.8–5.6)
Mean Plasma Glucose: 125.5 mg/dL

## 2021-02-03 SURGERY — ANTERIOR LATERAL LUMBAR FUSION WITH PERCUTANEOUS SCREW 1 LEVEL
Anesthesia: General | Laterality: Right

## 2021-02-03 MED ORDER — FLEET ENEMA 7-19 GM/118ML RE ENEM: 1.0000 | ENEMA | Freq: Once | RECTAL | Status: DC | PRN

## 2021-02-03 MED ORDER — ALUM & MAG HYDROXIDE-SIMETH 200-200-20 MG/5ML PO SUSP: 30.0000 mL | Freq: Four times a day (QID) | ORAL | Status: DC | PRN

## 2021-02-03 MED ORDER — CEFAZOLIN SODIUM-DEXTROSE 2-4 GM/100ML-% IV SOLN
2.0000 g | INTRAVENOUS | Status: AC
  Administered 2021-02-03: 2 g via INTRAVENOUS
  Filled 2021-02-03: qty 100

## 2021-02-03 MED ORDER — TIOTROPIUM BROMIDE-OLODATEROL 2.5-2.5 MCG/ACT IN AERS: 2.0000 | INHALATION_SPRAY | Freq: Every day | RESPIRATORY_TRACT | Status: DC

## 2021-02-03 MED ORDER — LIDOCAINE 2% (20 MG/ML) 5 ML SYRINGE
INTRAMUSCULAR | Status: AC
  Filled 2021-02-03: qty 5

## 2021-02-03 MED ORDER — FENTANYL CITRATE (PF) 100 MCG/2ML IJ SOLN
25.0000 ug | INTRAMUSCULAR | Status: DC | PRN
  Administered 2021-02-03 (×2): 25 ug via INTRAVENOUS
  Administered 2021-02-03: 50 ug via INTRAVENOUS

## 2021-02-03 MED ORDER — FENTANYL CITRATE (PF) 250 MCG/5ML IJ SOLN
INTRAMUSCULAR | Status: AC
  Filled 2021-02-03: qty 5

## 2021-02-03 MED ORDER — HYDROCODONE-ACETAMINOPHEN 7.5-325 MG PO TABS
2.0000 | ORAL_TABLET | ORAL | Status: DC | PRN
  Administered 2021-02-03 – 2021-02-05 (×8): 2 via ORAL
  Filled 2021-02-03 (×8): qty 2

## 2021-02-03 MED ORDER — SODIUM CHLORIDE 0.9% FLUSH: 3.0000 mL | INTRAVENOUS | Status: DC | PRN

## 2021-02-03 MED ORDER — LACTATED RINGERS IV SOLN: INTRAVENOUS | Status: DC | PRN

## 2021-02-03 MED ORDER — LORATADINE 10 MG PO TABS: 10.0000 mg | ORAL_TABLET | Freq: Every day | ORAL | Status: DC | PRN

## 2021-02-03 MED ORDER — FENTANYL CITRATE (PF) 250 MCG/5ML IJ SOLN
INTRAMUSCULAR | Status: DC | PRN
  Administered 2021-02-03: 100 ug via INTRAVENOUS
  Administered 2021-02-03 (×3): 50 ug via INTRAVENOUS

## 2021-02-03 MED ORDER — ONDANSETRON HCL 4 MG PO TABS: 4.0000 mg | ORAL_TABLET | Freq: Four times a day (QID) | ORAL | Status: DC | PRN

## 2021-02-03 MED ORDER — SENNOSIDES-DOCUSATE SODIUM 8.6-50 MG PO TABS: 1.0000 | ORAL_TABLET | Freq: Every evening | ORAL | Status: DC | PRN

## 2021-02-03 MED ORDER — HYDROCODONE-ACETAMINOPHEN 7.5-325 MG PO TABS: 1.0000 | ORAL_TABLET | ORAL | Status: DC | PRN

## 2021-02-03 MED ORDER — DEXAMETHASONE SODIUM PHOSPHATE 10 MG/ML IJ SOLN
INTRAMUSCULAR | Status: AC
  Filled 2021-02-03: qty 1

## 2021-02-03 MED ORDER — NON FORMULARY: Freq: Every day | Status: DC

## 2021-02-03 MED ORDER — PHENYLEPHRINE HCL-NACL 10-0.9 MG/250ML-% IV SOLN
INTRAVENOUS | Status: DC | PRN
  Administered 2021-02-03: 40 ug/min via INTRAVENOUS

## 2021-02-03 MED ORDER — ZOLPIDEM TARTRATE 5 MG PO TABS: 5.0000 mg | ORAL_TABLET | Freq: Every evening | ORAL | Status: DC | PRN

## 2021-02-03 MED ORDER — MORPHINE SULFATE (PF) 2 MG/ML IV SOLN
1.0000 mg | INTRAVENOUS | Status: DC | PRN
  Administered 2021-02-04 – 2021-02-05 (×2): 2 mg via INTRAVENOUS
  Filled 2021-02-03 (×2): qty 1

## 2021-02-03 MED ORDER — PROMETHAZINE HCL 25 MG/ML IJ SOLN: 6.2500 mg | INTRAMUSCULAR | Status: DC | PRN

## 2021-02-03 MED ORDER — DULOXETINE HCL 30 MG PO CPEP
60.0000 mg | ORAL_CAPSULE | Freq: Every evening | ORAL | Status: DC
  Administered 2021-02-03 – 2021-02-04 (×2): 60 mg via ORAL
  Filled 2021-02-03 (×2): qty 2

## 2021-02-03 MED ORDER — FENTANYL CITRATE (PF) 100 MCG/2ML IJ SOLN
INTRAMUSCULAR | Status: AC
  Filled 2021-02-03: qty 2

## 2021-02-03 MED ORDER — PROPOFOL 500 MG/50ML IV EMUL
INTRAVENOUS | Status: DC | PRN
  Administered 2021-02-03: 75 ug/kg/min via INTRAVENOUS
  Administered 2021-02-03: 100 ug/kg/min via INTRAVENOUS

## 2021-02-03 MED ORDER — ACETAMINOPHEN 650 MG RE SUPP: 650.0000 mg | RECTAL | Status: DC | PRN

## 2021-02-03 MED ORDER — ONDANSETRON HCL 4 MG/2ML IJ SOLN: 4.0000 mg | Freq: Four times a day (QID) | INTRAMUSCULAR | Status: DC | PRN

## 2021-02-03 MED ORDER — MAGNESIUM OXIDE 400 MG PO TABS
400.0000 mg | ORAL_TABLET | Freq: Every evening | ORAL | Status: DC
  Administered 2021-02-03 – 2021-02-04 (×2): 400 mg via ORAL
  Filled 2021-02-03 (×4): qty 1

## 2021-02-03 MED ORDER — ACETAMINOPHEN 325 MG PO TABS: 650.0000 mg | ORAL_TABLET | ORAL | Status: DC | PRN

## 2021-02-03 MED ORDER — DOCUSATE SODIUM 100 MG PO CAPS
100.0000 mg | ORAL_CAPSULE | Freq: Two times a day (BID) | ORAL | Status: DC
  Administered 2021-02-03 – 2021-02-05 (×5): 100 mg via ORAL
  Filled 2021-02-03 (×5): qty 1

## 2021-02-03 MED ORDER — BUPIVACAINE-EPINEPHRINE (PF) 0.25% -1:200000 IJ SOLN
INTRAMUSCULAR | Status: AC
  Filled 2021-02-03: qty 30

## 2021-02-03 MED ORDER — LIDOCAINE 2% (20 MG/ML) 5 ML SYRINGE
INTRAMUSCULAR | Status: DC | PRN
  Administered 2021-02-03: 60 mg via INTRAVENOUS

## 2021-02-03 MED ORDER — DEXAMETHASONE SODIUM PHOSPHATE 10 MG/ML IJ SOLN
INTRAMUSCULAR | Status: DC | PRN
  Administered 2021-02-03: 5 mg via INTRAVENOUS

## 2021-02-03 MED ORDER — SODIUM CHLORIDE 0.9% FLUSH
3.0000 mL | Freq: Two times a day (BID) | INTRAVENOUS | Status: DC
  Administered 2021-02-03: 3 mL via INTRAVENOUS

## 2021-02-03 MED ORDER — GABAPENTIN 300 MG PO CAPS
300.0000 mg | ORAL_CAPSULE | Freq: Three times a day (TID) | ORAL | Status: DC
  Administered 2021-02-03 – 2021-02-05 (×5): 300 mg via ORAL
  Filled 2021-02-03 (×5): qty 1

## 2021-02-03 MED ORDER — MIDAZOLAM HCL 2 MG/2ML IJ SOLN
INTRAMUSCULAR | Status: DC | PRN
  Administered 2021-02-03: 2 mg via INTRAVENOUS

## 2021-02-03 MED ORDER — GABAPENTIN 300 MG PO CAPS
300.0000 mg | ORAL_CAPSULE | Freq: Once | ORAL | Status: AC
  Administered 2021-02-03: 300 mg via ORAL
  Filled 2021-02-03: qty 1

## 2021-02-03 MED ORDER — ALBUTEROL SULFATE (2.5 MG/3ML) 0.083% IN NEBU: 3.0000 mL | INHALATION_SOLUTION | Freq: Four times a day (QID) | RESPIRATORY_TRACT | Status: DC | PRN

## 2021-02-03 MED ORDER — POTASSIUM CHLORIDE IN NACL 20-0.9 MEQ/L-% IV SOLN
INTRAVENOUS | Status: DC
  Filled 2021-02-03: qty 1000

## 2021-02-03 MED ORDER — SUCCINYLCHOLINE CHLORIDE 200 MG/10ML IV SOSY
PREFILLED_SYRINGE | INTRAVENOUS | Status: AC
  Filled 2021-02-03: qty 10

## 2021-02-03 MED ORDER — PHENOL 1.4 % MT LIQD: 1.0000 | OROMUCOSAL | Status: DC | PRN

## 2021-02-03 MED ORDER — BUPIVACAINE LIPOSOME 1.3 % IJ SUSP
INTRAMUSCULAR | Status: AC
  Filled 2021-02-03: qty 20

## 2021-02-03 MED ORDER — PROPOFOL 10 MG/ML IV BOLUS
INTRAVENOUS | Status: DC | PRN
  Administered 2021-02-03: 100 mg via INTRAVENOUS

## 2021-02-03 MED ORDER — CHLORHEXIDINE GLUCONATE 0.12 % MT SOLN
OROMUCOSAL | Status: AC
  Administered 2021-02-03: 15 mL
  Filled 2021-02-03: qty 15

## 2021-02-03 MED ORDER — THROMBIN 20000 UNITS EX SOLR
CUTANEOUS | Status: AC
  Filled 2021-02-03: qty 20000

## 2021-02-03 MED ORDER — METFORMIN HCL 500 MG PO TABS
500.0000 mg | ORAL_TABLET | Freq: Two times a day (BID) | ORAL | Status: DC
  Administered 2021-02-03 – 2021-02-05 (×3): 500 mg via ORAL
  Filled 2021-02-03 (×3): qty 1

## 2021-02-03 MED ORDER — METHOCARBAMOL 500 MG PO TABS
500.0000 mg | ORAL_TABLET | Freq: Four times a day (QID) | ORAL | Status: DC | PRN
  Administered 2021-02-03 – 2021-02-05 (×4): 500 mg via ORAL
  Filled 2021-02-03 (×4): qty 1

## 2021-02-03 MED ORDER — POVIDONE-IODINE 7.5 % EX SOLN: Freq: Once | CUTANEOUS | Status: DC

## 2021-02-03 MED ORDER — MENTHOL 3 MG MT LOZG: 1.0000 | LOZENGE | OROMUCOSAL | Status: DC | PRN

## 2021-02-03 MED ORDER — ATORVASTATIN CALCIUM 80 MG PO TABS
80.0000 mg | ORAL_TABLET | Freq: Every evening | ORAL | Status: DC
  Administered 2021-02-03 – 2021-02-04 (×2): 80 mg via ORAL
  Filled 2021-02-03 (×2): qty 1

## 2021-02-03 MED ORDER — BISACODYL 5 MG PO TBEC: 5.0000 mg | DELAYED_RELEASE_TABLET | Freq: Every day | ORAL | Status: DC | PRN

## 2021-02-03 MED ORDER — PROPOFOL 10 MG/ML IV BOLUS
INTRAVENOUS | Status: AC
  Filled 2021-02-03: qty 20

## 2021-02-03 MED ORDER — MIDAZOLAM HCL 2 MG/2ML IJ SOLN
INTRAMUSCULAR | Status: AC
  Filled 2021-02-03: qty 2

## 2021-02-03 MED ORDER — CYCLOBENZAPRINE HCL 5 MG PO TABS
5.0000 mg | ORAL_TABLET | Freq: Every evening | ORAL | Status: DC | PRN
  Administered 2021-02-03 – 2021-02-04 (×2): 5 mg via ORAL
  Administered 2021-02-04: 10 mg via ORAL
  Filled 2021-02-03: qty 1
  Filled 2021-02-03: qty 2
  Filled 2021-02-03: qty 1

## 2021-02-03 MED ORDER — ONDANSETRON HCL 4 MG/2ML IJ SOLN
INTRAMUSCULAR | Status: DC | PRN
  Administered 2021-02-03: 4 mg via INTRAVENOUS

## 2021-02-03 MED ORDER — SUCCINYLCHOLINE CHLORIDE 200 MG/10ML IV SOSY
PREFILLED_SYRINGE | INTRAVENOUS | Status: DC | PRN
  Administered 2021-02-03: 100 mg via INTRAVENOUS

## 2021-02-03 MED ORDER — ONDANSETRON HCL 4 MG/2ML IJ SOLN
INTRAMUSCULAR | Status: AC
  Filled 2021-02-03: qty 2

## 2021-02-03 MED ORDER — ACETAMINOPHEN 500 MG PO TABS
1000.0000 mg | ORAL_TABLET | Freq: Once | ORAL | Status: AC
  Administered 2021-02-03: 1000 mg via ORAL
  Filled 2021-02-03: qty 2

## 2021-02-03 MED ORDER — CEFAZOLIN SODIUM-DEXTROSE 2-4 GM/100ML-% IV SOLN
2.0000 g | Freq: Three times a day (TID) | INTRAVENOUS | Status: AC
  Administered 2021-02-03 – 2021-02-04 (×2): 2 g via INTRAVENOUS
  Filled 2021-02-03 (×2): qty 100

## 2021-02-03 SURGICAL SUPPLY — 72 items
APPLIER CLIP 11 MED OPEN (CLIP)
BATTALION LLIF ITRADISCAL SHIM (MISCELLANEOUS) ×3
BENZOIN TINCTURE PRP APPL 2/3 (GAUZE/BANDAGES/DRESSINGS) ×3 IMPLANT
BLADE CLIPPER SURG (BLADE) IMPLANT
BLADE SURG 10 STRL SS (BLADE) ×3 IMPLANT
BRUSH CYTOL CELLEBRITY 1X140 (MISCELLANEOUS) ×3 IMPLANT
CLIP APPLIE 11 MED OPEN (CLIP) IMPLANT
CLIP SPRING STIM LLIF SAFEOP (CLIP) ×3 IMPLANT
CLOSURE STERI-STRIP 1/4X4 (GAUZE/BANDAGES/DRESSINGS) ×3 IMPLANT
CLOSURE WOUND 1/2 X4 (GAUZE/BANDAGES/DRESSINGS) ×1
CORD BIPOLAR FORCEPS 12FT (ELECTRODE) ×3 IMPLANT
COUNTER NEEDLE 20 DBL MAG RED (NEEDLE) ×3 IMPLANT
COVER SURGICAL LIGHT HANDLE (MISCELLANEOUS) ×3 IMPLANT
COVER WAND RF STERILE (DRAPES) ×3 IMPLANT
DILATOR INSULATED LLIF 8-13-18 (NEUROSURGERY SUPPLIES) ×3 IMPLANT
DRAPE C-ARM 42X72 X-RAY (DRAPES) ×3 IMPLANT
DRAPE C-ARMOR (DRAPES) ×3 IMPLANT
DRAPE ORTHO SPLIT 77X108 STRL (DRAPES)
DRAPE POUCH INSTRU U-SHP 10X18 (DRAPES) ×3 IMPLANT
DRAPE SURG 17X23 STRL (DRAPES) ×9 IMPLANT
DRAPE SURG ORHT 6 SPLT 77X108 (DRAPES) IMPLANT
DURAPREP 26ML APPLICATOR (WOUND CARE) ×3 IMPLANT
ELECT BLADE 6.5 EXT (BLADE) ×3 IMPLANT
ELECT CAUTERY BLADE 6.4 (BLADE) ×3 IMPLANT
ELECT KIT SAFEOP SSEP/SURF (KITS) ×3
ELECT REM PT RETURN 9FT ADLT (ELECTROSURGICAL) ×3
ELECTRODE KT SAFEOP SSEP/SURF (KITS) ×1 IMPLANT
ELECTRODE REM PT RTRN 9FT ADLT (ELECTROSURGICAL) ×1 IMPLANT
GAUZE 4X4 16PLY RFD (DISPOSABLE) ×3 IMPLANT
GLOVE BIO SURGEON STRL SZ7 (GLOVE) ×6 IMPLANT
GLOVE BIO SURGEON STRL SZ8 (GLOVE) ×6 IMPLANT
GLOVE SRG 8 PF TXTR STRL LF DI (GLOVE) ×2 IMPLANT
GLOVE SURG UNDER POLY LF SZ7 (GLOVE) ×6 IMPLANT
GLOVE SURG UNDER POLY LF SZ8 (GLOVE) ×6
GOWN STRL REUS W/ TWL LRG LVL3 (GOWN DISPOSABLE) ×1 IMPLANT
GOWN STRL REUS W/ TWL XL LVL3 (GOWN DISPOSABLE) ×1 IMPLANT
GOWN STRL REUS W/TWL LRG LVL3 (GOWN DISPOSABLE) ×3
GOWN STRL REUS W/TWL XL LVL3 (GOWN DISPOSABLE) ×3
KIT BASIN OR (CUSTOM PROCEDURE TRAY) ×3 IMPLANT
KIT TURNOVER KIT B (KITS) ×3 IMPLANT
KNIFE ANNULOTOMY (BLADE) ×3 IMPLANT
NEEDLE HYPO 22GX1.5 SAFETY (NEEDLE) ×3 IMPLANT
NEEDLE HYPO 25GX1X1/2 BEV (NEEDLE) ×6 IMPLANT
NEEDLE SPNL 18GX3.5 QUINCKE PK (NEEDLE) IMPLANT
NS IRRIG 1000ML POUR BTL (IV SOLUTION) ×3 IMPLANT
PACK LAMINECTOMY ORTHO (CUSTOM PROCEDURE TRAY) ×3 IMPLANT
PACK UNIVERSAL I (CUSTOM PROCEDURE TRAY) ×3 IMPLANT
PAD ARMBOARD 7.5X6 YLW CONV (MISCELLANEOUS) ×9 IMPLANT
PROBE BALL TIP LLIF SAFEOP (NEUROSURGERY SUPPLIES) ×3 IMPLANT
PUTTY BONE DBX 5CC MIX (Putty) ×3 IMPLANT
SHIM ITRADISCAL BATTALION LLIF (MISCELLANEOUS) ×1 IMPLANT
SPACER LLIF BATT 18X8X50 0D (Spacer) ×3 IMPLANT
SPONGE INTESTINAL PEANUT (DISPOSABLE) ×6 IMPLANT
SPONGE LAP 4X18 RFD (DISPOSABLE) ×3 IMPLANT
SPONGE SURGIFOAM ABS GEL 100 (HEMOSTASIS) IMPLANT
STRIP CLOSURE SKIN 1/2X4 (GAUZE/BANDAGES/DRESSINGS) ×2 IMPLANT
SURGIFLO W/THROMBIN 8M KIT (HEMOSTASIS) IMPLANT
SUT MNCRL AB 4-0 PS2 18 (SUTURE) ×3 IMPLANT
SUT PROLENE 5 0 C 1 24 (SUTURE) IMPLANT
SUT VIC AB 0 CT1 18XCR BRD 8 (SUTURE) ×1 IMPLANT
SUT VIC AB 0 CT1 8-18 (SUTURE) ×3
SUT VIC AB 1 CT1 18XCR BRD 8 (SUTURE) ×2 IMPLANT
SUT VIC AB 1 CT1 8-18 (SUTURE) ×6
SUT VIC AB 2-0 CT2 18 VCP726D (SUTURE) ×6 IMPLANT
SYR BULB IRRIG 60ML STRL (SYRINGE) ×3 IMPLANT
SYR CONTROL 10ML LL (SYRINGE) ×6 IMPLANT
TAPE CLOTH SURG 6X10 WHT LF (GAUZE/BANDAGES/DRESSINGS) ×3 IMPLANT
TOWEL GREEN STERILE (TOWEL DISPOSABLE) ×3 IMPLANT
TOWEL GREEN STERILE FF (TOWEL DISPOSABLE) ×3 IMPLANT
TRAY FOLEY MTR SLVR 16FR STAT (SET/KITS/TRAYS/PACK) ×3 IMPLANT
WATER STERILE IRR 1000ML POUR (IV SOLUTION) ×3 IMPLANT
YANKAUER SUCT BULB TIP NO VENT (SUCTIONS) ×3 IMPLANT

## 2021-02-03 NOTE — Anesthesia Preprocedure Evaluation (Addendum)
Anesthesia Evaluation  Patient identified by MRN, date of birth, ID band Patient awake    Reviewed: Allergy & Precautions, NPO status , Patient's Chart, lab work & pertinent test results  History of Anesthesia Complications Negative for: history of anesthetic complications  Airway Mallampati: II  TM Distance: >3 FB Neck ROM: Full    Dental  (+) Dental Advisory Given, Teeth Intact   Pulmonary COPD,  COPD inhaler, former smoker,    Pulmonary exam normal        Cardiovascular negative cardio ROS Normal cardiovascular exam     Neuro/Psych  Headaches, PSYCHIATRIC DISORDERS Anxiety Depression    GI/Hepatic negative GI ROS, Neg liver ROS,   Endo/Other  diabetes, Type 2, Oral Hypoglycemic Agents  Renal/GU negative Renal ROS     Musculoskeletal  (+) Arthritis ,   Abdominal   Peds  Hematology negative hematology ROS (+)   Anesthesia Other Findings Covid test negative   Reproductive/Obstetrics                            Anesthesia Physical Anesthesia Plan  ASA: III  Anesthesia Plan: General   Post-op Pain Management:    Induction: Intravenous  PONV Risk Score and Plan: 4 or greater and Treatment may vary due to age or medical condition, Ondansetron, Dexamethasone, Midazolam and Scopolamine patch - Pre-op  Airway Management Planned: Oral ETT  Additional Equipment: None  Intra-op Plan:   Post-operative Plan: Extubation in OR  Informed Consent: I have reviewed the patients History and Physical, chart, labs and discussed the procedure including the risks, benefits and alternatives for the proposed anesthesia with the patient or authorized representative who has indicated his/her understanding and acceptance.     Dental advisory given  Plan Discussed with: CRNA and Anesthesiologist  Anesthesia Plan Comments:        Anesthesia Quick Evaluation

## 2021-02-03 NOTE — Op Note (Signed)
PATIENT NAME: Susan Berger   MEDICAL RECORD NO.:   [id number]    DATE OF BIRTH: 1961-03-03   DATE OF PROCEDURE: 02/03/2021                               OPERATIVE REPORT   PREOPERATIVE DIAGNOSES: 1.  Left-sided L4 radiculopathy. 2.  S/p previous L2-L4 fusion 3.  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  Left-sided L4 radiculopathy. 2.  S/p previous L2-L4 fusion 3.  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  Right-sided L4/5 lateral interbody fusion via direct lateral retroperitoneal approach. 2.  Insertion of interbody device x1 (8 x 18 mm x 50 mm Alphatec intervertebral spacer). 3.  Use of morselized allograft -- DBX-mix   4.  Intraoperative use of fluoroscopy.  SURGEON:  Phylliss Bob, MD  ASSISTANT:  Pricilla Holm PA-C.  ANESTHESIA:  General endotracheal anesthesia.  COMPLICATIONS:  None.  DISPOSITION:  Stable.  ESTIMATED BLOOD LOSS:  Minimal.  INDICATIONS:  Briefly, Susan Berger is a very pleasant 60 year old female who did present to me with ongoing pain in the left leg.  He did have significant degenerative changes and pressure on the left L4 and L5 nerves, which I did feel was correlated to his symptoms.  Given the patient's ongoing symptoms, and lack of improvement with appropriate conservative treatment measures, I did discuss proceeding with a 2-stage procedure, starting with a direct lateral interbody fusion as reflected above.  The patient was fully aware of the risks and limitations of surgery, and did wish to proceed.  DESCRIPTION OF PROCEDURE:  On 02/03/2021, the patient was brought to surgery and general endotracheal anesthesia was administered.  The patient was placed in the lateral decubitus position, with the right side up.  Neurologic monitoring leads were placed by the monitoring  technicians.  The patient's torso and lower extremities were secured to the bed.  The patient's hips and knees were flexed in order to lessen the tension on the psoas musculature.  The right flank was then prepped and draped in the usual  sterile fashion.  The bed was flexed, in order to optimize exposure to the L4-5 intervertebral space.  After a timeout procedure was performed, a right-sided transverse incision was made over the right flank overlying the L4-5 intervertebral space.  The superior aspect of the iliac crest was in the region of the L4-5 intervertebral space.  I did dissect just above the iliac crest, and through the external and internal oblique musculature, and transversalis musculature and fascia.  The retroperitoneal space was encountered.  The peritoneum was bluntly swept anteriorly, and the psoas was readily identified.  I did use a series of dilators to dock over the L4-5 intervertebral space.  I did use neurologic monitoring while placing the dilators, in order to ensure that there were no neurologic structures in the immediate vicinity of the dilators.  Of note, the lumbar plexus was noted to be significantly more anterior than usual.  This did obligate need to place the dilators and ultimately, the retractor, in a much more anterior location than usual.  I was ultimately able to safely secure a rigid self-retaining retractor, with the anterior aspect of it being in line with the anterior disc space.  The retractor was then attached to a rigid arm.  A shim was placed into the upper aspect of the L4/5 disc space.  I then used a knife to perform an annulotomy at the lateral aspect of the L4-5 intervertebral space.  I then used a series of curettes and pituitary rongeurs in order to perform a thorough and complete L4-5 intervertebral diskectomy.  The contralateral annulus was released.  I then placed a series of intervertebral spacer trials, and I did feel that a 10 x 18 mm x 50 mm spacer would  be the most appropriate fit. The appropriate spacer was then packed with DBX-mix and tamped into position.  I was very pleased with the final resting position of the intervertebral spacer, which was in line with the anterior aspect of the intervertebral space, and within the annulus.  I was very pleased with the final AP and lateral fluoroscopic images and the excellent restoration of disk height identified on both AP and lateral images.  Of note, I  did use neurologic monitoring throughout the entire surgery, and there was no sustained EMG activity noted throughout the entire surgery. The fascia, internal, and external oblique musculature was closed using #1 Vicryl.  The subcutaneous layer was closed using 2-0 Vicryl and the skin was closed using 4-0 Monocryl.  Benzoin and Steri-Strips were applied followed by sterile dressing.  All instrument counts were correct at the termination of the procedure.  Of note, Pricilla Holm was my assistant throughout surgery, and did aid in retraction, placement of the hardware, suctioning, and closure.  Phylliss Bob, MD

## 2021-02-03 NOTE — Transfer of Care (Signed)
Immediate Anesthesia Transfer of Care Note  Patient: Susan Berger  Procedure(s) Performed: RIGHT-SIDED LUMBAR 4- LUMBAR 5 LATERAL INTERBODY FUSION WITH INSTRUMENTATION AND ALLOGRAFT (Right )  Patient Location: PACU  Anesthesia Type:General  Level of Consciousness: awake and alert   Airway & Oxygen Therapy: Patient Spontanous Breathing and Patient connected to face mask oxygen  Post-op Assessment: Report given to RN, Post -op Vital signs reviewed and stable and Patient moving all extremities X 4  Post vital signs: Reviewed and stable  Last Vitals:  Vitals Value Taken Time  BP 138/85 02/03/21 1129  Temp    Pulse 83 02/03/21 1133  Resp 17 02/03/21 1133  SpO2 100 % 02/03/21 1133  Vitals shown include unvalidated device data.  Last Pain:  Vitals:   02/03/21 0705  TempSrc:   PainSc: 7          Complications: No complications documented.

## 2021-02-03 NOTE — Anesthesia Procedure Notes (Signed)
Procedure Name: Intubation Date/Time: 02/03/2021 8:38 AM Performed by: Reece Agar, CRNA Pre-anesthesia Checklist: Patient identified, Emergency Drugs available, Suction available and Patient being monitored Patient Re-evaluated:Patient Re-evaluated prior to induction Oxygen Delivery Method: Circle System Utilized Preoxygenation: Pre-oxygenation with 100% oxygen Induction Type: IV induction Ventilation: Mask ventilation without difficulty Laryngoscope Size: Mac and 3 Tube type: Oral Tube size: 7.0 mm Number of attempts: 1 Airway Equipment and Method: Stylet Placement Confirmation: ETT inserted through vocal cords under direct vision,  positive ETCO2 and breath sounds checked- equal and bilateral Secured at: 22 cm Tube secured with: Tape Dental Injury: Teeth and Oropharynx as per pre-operative assessment

## 2021-02-03 NOTE — H&P (Signed)
PREOPERATIVE H&P  Chief Complaint: Left leg pain  HPI: Susan Berger is a 60 y.o. 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 Allergy   . Anxiety   . Anxiety   . Arthritis   . Borderline diabetes   . Constipation    uses magnesium daily and with this stools are sofe  . COPD (chronic obstructive pulmonary disease) ([place])   . Depression   . Diabetes mellitus    Type II  . History of kidney stones   . Hyperlipidemia   . Migraine    hasn't had one in 1 year  . Multiple allergies    allergy vac 3 days a week   Past Surgical History:  Procedure Laterality Date  . ANTERIOR LAT LUMBAR FUSION N/A 03/12/2020   Procedure: LEFT ANTERIOR LATERAL INTERBODY FUSION LUMBAR 2-3 AND LUMBAR 3-4;  Surgeon: Phylliss Bob, MD;  Location: [facility];  Service: Orthopedics;  Laterality: N/A;  . COLONOSCOPY    . KIDNEY SURGERY     kidney stones removed  . PARTIAL HYSTERECTOMY    . VESICOVAGINAL FISTULA CLOSURE W/ TAH     Social History   Socioeconomic History  . Marital status: Married    Spouse name: Not on file  . Number of children: Not on file  . Years of education: Not on file  . Highest education level: Not on file  Occupational History  . Not on file  Tobacco Use  . Smoking status: Former Smoker    Packs/day: 1.00    Years: 33.00    Pack years: 33.00    Types: Cigarettes    Start date: 68    Quit date: 04/01/2011    Years since quitting: 9.8  . Smokeless tobacco: Never Used  Vaping Use  . Vaping Use: Never used  Substance and Sexual Activity  . Alcohol use: Not Currently    Alcohol/week: 0.0 standard drinks    Comment:  rare  . Drug use: No  . Sexual activity: Not on file  Other Topics Concern  . Not on file  Social History Narrative  . Not on file   Social Determinants of Health   Financial Resource Strain: Not on file  Food Insecurity: Not on file  Transportation Needs: Not on file  Physical Activity: Not on file  Stress: Not on file  Social Connections: Not on file   Family History  Problem Relation Age of Onset  . Emphysema Mother   . COPD Mother   . Lung cancer Mother   . Asthma Paternal Grandmother   . Lung cancer Paternal Grandfather   . Diabetes Other   . Colon cancer Neg Hx   . Colon polyps Neg Hx    No Known Allergies Prior to Admission medications   Medication Sig Start Date End Date Taking? Authorizing Provider  albuterol (VENTOLIN HFA) 108 (90 Base) MCG/ACT inhaler INHALE 2 PUFFS INTO THE LUNGS EVERY 6 HOURS AS NEEDED FOR WHEEZING Patient taking differently: Inhale 2 puffs into the lungs every 6 (six) hours as needed for wheezing or shortness of breath. INHALE 2 PUFFS INTO THE LUNGS EVERY  6 HOURS AS NEEDED FOR WHEEZING. 09/11/20  Yes Collene Gobble, MD  atorvastatin (LIPITOR) 80 MG tablet Take 80 mg by mouth every evening.  03/28/19  Yes [provider]  cyclobenzaprine (FLEXERIL) 5 MG tablet Take 5-10 mg by mouth at bedtime as needed for muscle spasms. 12/08/20  Yes [provider]  CYMBALTA 60 MG capsule Take 60 mg by mouth every evening.  08/15/11  Yes [provider]  loratadine (CLARITIN) 10 MG tablet Take 10 mg by mouth daily as needed for allergies.    Yes [provider]  magnesium oxide (MAG-OX) 400 MG tablet Take 400 mg by mouth every evening.   Yes [provider]  metFORMIN (GLUCOPHAGE) 500 MG tablet Take 500 mg by mouth in the morning and at bedtime.  08/30/11  Yes [provider]  methocarbamol (ROBAXIN) 500 MG tablet Take 1 tablet (500 mg total) by mouth every 6 (six) hours as needed for muscle spasms. 03/13/20   Yes Nghia Mcentee, Elta Guadeloupe, MD  NEURONTIN 300 MG capsule Take 300 mg by mouth 3 (three) times daily. 12/14/20  Yes [provider]  [place] 2.5-2.5 MCG/ACT AERS INHALE 2 PUFFS BY MOUTH INTO THE LUNGS DAILY Patient taking differently: Inhale 2 puffs into the lungs daily. INHALE 2 PUFFS BY MOUTH INTO THE LUNGS DAILY 07/02/20  Yes Collene Gobble, MD  traMADol (ULTRAM) 50 MG tablet Take 50 mg by mouth 3 (three) times daily as needed for severe pain. 12/08/20  Yes [provider]     All other systems have been reviewed and were otherwise negative with the exception of those mentioned in the HPI and as above.  Physical Exam: Vitals:   02/03/21 0647  BP: 126/73  Pulse: 72  Resp: 17  Temp: 98.2 F (36.8 C)  SpO2: 96%    Body mass index is 22.8 kg/m.  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

## 2021-02-04 ENCOUNTER — Encounter (HOSPITAL_COMMUNITY): Payer: Self-pay | Admitting: Orthopedic Surgery

## 2021-02-04 ENCOUNTER — Inpatient Hospital Stay (HOSPITAL_COMMUNITY): Payer: 59 | Admitting: Anesthesiology

## 2021-02-04 ENCOUNTER — Inpatient Hospital Stay (HOSPITAL_COMMUNITY): Admission: RE | Admit: 2021-02-04 | Payer: 59 | Source: Home / Self Care | Admitting: Orthopedic Surgery

## 2021-02-04 ENCOUNTER — Inpatient Hospital Stay (HOSPITAL_COMMUNITY): Payer: 59

## 2021-02-04 ENCOUNTER — Inpatient Hospital Stay (HOSPITAL_COMMUNITY)
Admission: RE | Disposition: A | Discharge status: Home / Self Care | Payer: Self-pay | Source: Home / Self Care | Attending: Orthopedic Surgery

## 2021-02-04 LAB — GLUCOSE, CAPILLARY
Glucose-Capillary: 97 mg/dL (ref 70–99)
Glucose-Capillary: 132 mg/dL — ABNORMAL HIGH (ref 70–99)
Glucose-Capillary: 137 mg/dL — ABNORMAL HIGH (ref 70–99)
Glucose-Capillary: 146 mg/dL — ABNORMAL HIGH (ref 70–99)
Glucose-Capillary: 98 mg/dL (ref 70–99)

## 2021-02-04 SURGERY — POSTERIOR LUMBAR FUSION 1 LEVEL
Anesthesia: General

## 2021-02-04 MED ORDER — BUPIVACAINE-EPINEPHRINE 0.25% -1:200000 IJ SOLN
INTRAMUSCULAR | Status: DC | PRN
  Administered 2021-02-04 (×2): 5 mL

## 2021-02-04 MED ORDER — 0.9 % SODIUM CHLORIDE (POUR BTL) OPTIME
TOPICAL | Status: DC | PRN
  Administered 2021-02-04 (×2): 1000 mL

## 2021-02-04 MED ORDER — DEXAMETHASONE SODIUM PHOSPHATE 10 MG/ML IJ SOLN
INTRAMUSCULAR | Status: AC
  Filled 2021-02-04: qty 1

## 2021-02-04 MED ORDER — FENTANYL CITRATE (PF) 100 MCG/2ML IJ SOLN
25.0000 ug | INTRAMUSCULAR | Status: DC | PRN
  Administered 2021-02-04: 25 ug via INTRAVENOUS
  Administered 2021-02-04: 50 ug via INTRAVENOUS

## 2021-02-04 MED ORDER — CEFAZOLIN SODIUM-DEXTROSE 2-3 GM-%(50ML) IV SOLR
INTRAVENOUS | Status: DC | PRN
  Administered 2021-02-04: 2 g via INTRAVENOUS

## 2021-02-04 MED ORDER — DEXAMETHASONE SODIUM PHOSPHATE 10 MG/ML IJ SOLN
INTRAMUSCULAR | Status: DC | PRN
  Administered 2021-02-04: 5 mg via INTRAVENOUS

## 2021-02-04 MED ORDER — LIDOCAINE 2% (20 MG/ML) 5 ML SYRINGE
INTRAMUSCULAR | Status: DC | PRN
  Administered 2021-02-04: 100 mg via INTRAVENOUS

## 2021-02-04 MED ORDER — OXYCODONE HCL 5 MG/5ML PO SOLN: 5.0000 mg | Freq: Once | ORAL | Status: DC | PRN

## 2021-02-04 MED ORDER — FENTANYL CITRATE (PF) 250 MCG/5ML IJ SOLN
INTRAMUSCULAR | Status: AC
  Filled 2021-02-04: qty 5

## 2021-02-04 MED ORDER — CEFAZOLIN SODIUM-DEXTROSE 2-4 GM/100ML-% IV SOLN: 2.0000 g | INTRAVENOUS | Status: DC

## 2021-02-04 MED ORDER — LACTATED RINGERS IV SOLN: INTRAVENOUS | Status: DC | PRN

## 2021-02-04 MED ORDER — BUPIVACAINE LIPOSOME 1.3 % IJ SUSP
INTRAMUSCULAR | Status: DC | PRN
  Administered 2021-02-04 (×2): 5 mL

## 2021-02-04 MED ORDER — PROPOFOL 10 MG/ML IV BOLUS
INTRAVENOUS | Status: DC | PRN
  Administered 2021-02-04: 150 mg via INTRAVENOUS

## 2021-02-04 MED ORDER — CEFAZOLIN SODIUM-DEXTROSE 2-4 GM/100ML-% IV SOLN
INTRAVENOUS | Status: AC
  Filled 2021-02-04: qty 100

## 2021-02-04 MED ORDER — LIDOCAINE 2% (20 MG/ML) 5 ML SYRINGE
INTRAMUSCULAR | Status: AC
  Filled 2021-02-04: qty 5

## 2021-02-04 MED ORDER — THROMBIN 20000 UNITS EX SOLR
CUTANEOUS | Status: DC | PRN
  Administered 2021-02-04 (×2): 20000 [IU] via TOPICAL

## 2021-02-04 MED ORDER — HYDROMORPHONE HCL 1 MG/ML IJ SOLN
INTRAMUSCULAR | Status: DC | PRN
  Administered 2021-02-04: .5 mg via INTRAVENOUS

## 2021-02-04 MED ORDER — OXYCODONE HCL 5 MG PO TABS
5.0000 mg | ORAL_TABLET | Freq: Once | ORAL | Status: DC | PRN
Start: 2021-02-04 — End: 2021-02-04

## 2021-02-04 MED ORDER — INSULIN ASPART 100 UNIT/ML ~~LOC~~ SOLN: 0.0000 [IU] | Freq: Every day | SUBCUTANEOUS | Status: DC

## 2021-02-04 MED ORDER — ONDANSETRON HCL 4 MG/2ML IJ SOLN
INTRAMUSCULAR | Status: DC | PRN
  Administered 2021-02-04: 4 mg via INTRAVENOUS

## 2021-02-04 MED ORDER — ONDANSETRON HCL 4 MG/2ML IJ SOLN: 4.0000 mg | Freq: Once | INTRAMUSCULAR | Status: DC | PRN

## 2021-02-04 MED ORDER — FENTANYL CITRATE (PF) 100 MCG/2ML IJ SOLN
INTRAMUSCULAR | Status: AC
  Filled 2021-02-04: qty 2

## 2021-02-04 MED ORDER — INSULIN ASPART 100 UNIT/ML ~~LOC~~ SOLN: 0.0000 [IU] | Freq: Three times a day (TID) | SUBCUTANEOUS | Status: DC

## 2021-02-04 MED ORDER — PHENYLEPHRINE HCL-NACL 10-0.9 MG/250ML-% IV SOLN
INTRAVENOUS | Status: AC
  Filled 2021-02-04: qty 500

## 2021-02-04 MED ORDER — SUGAMMADEX SODIUM 200 MG/2ML IV SOLN
INTRAVENOUS | Status: DC | PRN
  Administered 2021-02-04: 200 mg via INTRAVENOUS

## 2021-02-04 MED ORDER — METHYLENE BLUE 0.5 % INJ SOLN
INTRAVENOUS | Status: DC | PRN
  Administered 2021-02-04: 1 mL via SUBMUCOSAL

## 2021-02-04 MED ORDER — HYDROMORPHONE HCL 1 MG/ML IJ SOLN
INTRAMUSCULAR | Status: AC
  Filled 2021-02-04: qty 0.5

## 2021-02-04 MED ORDER — PROPOFOL 1000 MG/100ML IV EMUL
INTRAVENOUS | Status: AC
  Filled 2021-02-04: qty 200

## 2021-02-04 MED ORDER — PHENYLEPHRINE HCL-NACL 10-0.9 MG/250ML-% IV SOLN
INTRAVENOUS | Status: DC | PRN
  Administered 2021-02-04: 40 ug/min via INTRAVENOUS

## 2021-02-04 MED ORDER — PROPOFOL 10 MG/ML IV BOLUS
INTRAVENOUS | Status: AC
  Filled 2021-02-04: qty 40

## 2021-02-04 MED ORDER — ONDANSETRON HCL 4 MG/2ML IJ SOLN
INTRAMUSCULAR | Status: AC
  Filled 2021-02-04: qty 2

## 2021-02-04 MED ORDER — ROCURONIUM BROMIDE 10 MG/ML (PF) SYRINGE
PREFILLED_SYRINGE | INTRAVENOUS | Status: DC | PRN
  Administered 2021-02-04: 70 mg via INTRAVENOUS
  Administered 2021-02-04: 20 mg via INTRAVENOUS
  Administered 2021-02-04: 30 mg via INTRAVENOUS

## 2021-02-04 MED ORDER — PHENYLEPHRINE HCL (PRESSORS) 10 MG/ML IV SOLN
INTRAVENOUS | Status: DC | PRN
  Administered 2021-02-04 (×2): 80 ug via INTRAVENOUS

## 2021-02-04 MED ORDER — POVIDONE-IODINE 7.5 % EX SOLN: Freq: Once | CUTANEOUS | Status: DC

## 2021-02-04 MED ORDER — MIDAZOLAM HCL 5 MG/5ML IJ SOLN
INTRAMUSCULAR | Status: DC | PRN
  Administered 2021-02-04: 2 mg via INTRAVENOUS

## 2021-02-04 MED ORDER — FENTANYL CITRATE (PF) 100 MCG/2ML IJ SOLN
INTRAMUSCULAR | Status: DC | PRN
  Administered 2021-02-04 (×2): 100 ug via INTRAVENOUS
  Administered 2021-02-04: 50 ug via INTRAVENOUS

## 2021-02-04 MED ORDER — MIDAZOLAM HCL 2 MG/2ML IJ SOLN
INTRAMUSCULAR | Status: AC
  Filled 2021-02-04: qty 2

## 2021-02-04 MED FILL — Thrombin For Soln 20000 Unit: CUTANEOUS | Qty: 1 | Status: AC

## 2021-02-04 SURGICAL SUPPLY — 79 items
BENZOIN TINCTURE PRP APPL 2/3 (GAUZE/BANDAGES/DRESSINGS) ×2 IMPLANT
BUR PRESCISION 1.7 ELITE (BURR) ×2 IMPLANT
BUR ROUND FLUTED 5 RND (BURR) ×2 IMPLANT
BUR ROUND PRECISION 4.0 (BURR) ×2 IMPLANT
BUR SABER RD CUTTING 3.0 (BURR) ×2 IMPLANT
CARTRIDGE OIL MAESTRO DRILL (MISCELLANEOUS) ×1 IMPLANT
CLSR STERI-STRIP ANTIMIC 1/2X4 (GAUZE/BANDAGES/DRESSINGS) ×2 IMPLANT
CNTNR URN SCR LID CUP LEK RST (MISCELLANEOUS) ×1 IMPLANT
CONT SPEC 4OZ STRL OR WHT (MISCELLANEOUS) ×2
COVER MAYO STAND STRL (DRAPES) ×4 IMPLANT
COVER SURGICAL LIGHT HANDLE (MISCELLANEOUS) ×2 IMPLANT
COVER WAND RF STERILE (DRAPES) ×2 IMPLANT
DIFFUSER DRILL AIR PNEUMATIC (MISCELLANEOUS) ×2 IMPLANT
DRAPE C-ARM 42X72 X-RAY (DRAPES) ×2 IMPLANT
DRAPE C-ARMOR (DRAPES) ×2 IMPLANT
DRAPE POUCH INSTRU U-SHP 10X18 (DRAPES) ×2 IMPLANT
DRAPE SURG 17X23 STRL (DRAPES) ×8 IMPLANT
DURAPREP 26ML APPLICATOR (WOUND CARE) ×2 IMPLANT
ELECT BLADE 4.0 EZ CLEAN MEGAD (MISCELLANEOUS) ×2
ELECT CAUTERY BLADE 6.4 (BLADE) ×2 IMPLANT
ELECT REM PT RETURN 9FT ADLT (ELECTROSURGICAL) ×2
ELECTRODE BLDE 4.0 EZ CLN MEGD (MISCELLANEOUS) ×1 IMPLANT
ELECTRODE REM PT RTRN 9FT ADLT (ELECTROSURGICAL) ×1 IMPLANT
FILTER STRAW FLUID ASPIR (MISCELLANEOUS) ×2 IMPLANT
GAUZE 4X4 16PLY RFD (DISPOSABLE) ×2 IMPLANT
GAUZE SPONGE 4X4 12PLY STRL (GAUZE/BANDAGES/DRESSINGS) ×2 IMPLANT
GLOVE BIO SURGEON STRL SZ7 (GLOVE) ×2 IMPLANT
GLOVE BIO SURGEON STRL SZ8 (GLOVE) ×2 IMPLANT
GLOVE SRG 8 PF TXTR STRL LF DI (GLOVE) ×1 IMPLANT
GLOVE SURG UNDER POLY LF SZ7 (GLOVE) ×2 IMPLANT
GLOVE SURG UNDER POLY LF SZ8 (GLOVE) ×1
GOWN STRL REUS W/ TWL LRG LVL3 (GOWN DISPOSABLE) ×2 IMPLANT
GOWN STRL REUS W/ TWL XL LVL3 (GOWN DISPOSABLE) ×1 IMPLANT
GOWN STRL REUS W/TWL LRG LVL3 (GOWN DISPOSABLE) ×2
GOWN STRL REUS W/TWL XL LVL3 (GOWN DISPOSABLE) ×2
GUIDEWIRE SHARP VIPER II (WIRE) ×10 IMPLANT
IV CATH 14GX2 1/4 (CATHETERS) ×2 IMPLANT
KIT ALARA NEURO ACCESS (KITS) ×4 IMPLANT
KIT BASIN OR (CUSTOM PROCEDURE TRAY) ×2 IMPLANT
KIT INFUSE MEDIUM (Orthopedic Implant) ×2 IMPLANT
KIT POSITION SURG JACKSON T1 (MISCELLANEOUS) ×2 IMPLANT
KIT TURNOVER KIT B (KITS) ×2 IMPLANT
MARKER SKIN DUAL TIP RULER LAB (MISCELLANEOUS) ×4 IMPLANT
NEEDLE 18GX1X1/2 (RX/OR ONLY) (NEEDLE) ×2 IMPLANT
NEEDLE 22X1 1/2 (OR ONLY) (NEEDLE) ×4 IMPLANT
NEEDLE HYPO 25GX1X1/2 BEV (NEEDLE) ×2 IMPLANT
NEEDLE SPNL 18GX3.5 QUINCKE PK (NEEDLE) ×4 IMPLANT
NS IRRIG 1000ML POUR BTL (IV SOLUTION) ×2 IMPLANT
OIL CARTRIDGE MAESTRO DRILL (MISCELLANEOUS) ×2
PACK LAMINECTOMY ORTHO (CUSTOM PROCEDURE TRAY) ×2 IMPLANT
PACK UNIVERSAL I (CUSTOM PROCEDURE TRAY) ×2 IMPLANT
PAD ARMBOARD 7.5X6 YLW CONV (MISCELLANEOUS) ×4 IMPLANT
PATTIES SURGICAL .5 X1 (DISPOSABLE) ×2 IMPLANT
PATTIES SURGICAL .5X1.5 (GAUZE/BANDAGES/DRESSINGS) ×2 IMPLANT
ROD EXPEDIUM PREBENT 95MM (Rod) ×4 IMPLANT
SCREW POLY VIPER2 7X40MM (Screw) ×4 IMPLANT
SCREW SET SINGLE INNER (Screw) ×16 IMPLANT
SHEET CONFORM 45LX20WX7H (Bone Implant) ×4 IMPLANT
SPONGE INTESTINAL PEANUT (DISPOSABLE) ×2 IMPLANT
SPONGE SURGIFOAM ABS GEL 100 (HEMOSTASIS) ×2 IMPLANT
STRIP CLOSURE SKIN 1/2X4 (GAUZE/BANDAGES/DRESSINGS) ×4 IMPLANT
SURGIFLO W/THROMBIN 8M KIT (HEMOSTASIS) ×4 IMPLANT
SUT MNCRL AB 4-0 PS2 18 (SUTURE) ×4 IMPLANT
SUT VIC AB 0 CT1 18XCR BRD 8 (SUTURE) ×1 IMPLANT
SUT VIC AB 0 CT1 8-18 (SUTURE) ×2
SUT VIC AB 1 CT1 18XCR BRD 8 (SUTURE) ×2 IMPLANT
SUT VIC AB 1 CT1 8-18 (SUTURE) ×4
SUT VIC AB 2-0 CT2 18 VCP726D (SUTURE) ×10 IMPLANT
SYR 20ML LL LF (SYRINGE) ×4 IMPLANT
SYR BULB IRRIG 60ML STRL (SYRINGE) ×2 IMPLANT
SYR CONTROL 10ML LL (SYRINGE) ×4 IMPLANT
SYR TB 1ML LUER SLIP (SYRINGE) ×2 IMPLANT
TAP CANN VIPER2 DL 5.0 (TAP) ×2 IMPLANT
TAP CANN VIPER2 DL 6.0 (TAP) ×2 IMPLANT
TAP CANN VIPER2 DL 7.0 (TAP) ×2 IMPLANT
TAPE CLOTH SURG 4X10 WHT LF (GAUZE/BANDAGES/DRESSINGS) ×2 IMPLANT
TRAY FOLEY MTR SLVR 16FR STAT (SET/KITS/TRAYS/PACK) ×2 IMPLANT
WATER STERILE IRR 1000ML POUR (IV SOLUTION) ×2 IMPLANT
YANKAUER SUCT BULB TIP NO VENT (SUCTIONS) ×2 IMPLANT

## 2021-02-04 NOTE — Anesthesia Postprocedure Evaluation (Signed)
Anesthesia Post Note  Patient: QUINCY PRISCO  Procedure(s) Performed: RIGHT-SIDED LUMBAR 4- LUMBAR 5 LATERAL INTERBODY FUSION WITH INSTRUMENTATION AND ALLOGRAFT (Right )     Patient location during evaluation: PACU Anesthesia Type: General Level of consciousness: awake and alert Pain management: pain level controlled Vital Signs Assessment: post-procedure vital signs reviewed and stable Respiratory status: spontaneous breathing, nonlabored ventilation, respiratory function stable and patient connected to nasal cannula oxygen Cardiovascular status: blood pressure returned to baseline and stable Postop Assessment: no apparent nausea or vomiting Anesthetic complications: no   No complications documented.  Last Vitals:  Vitals:   02/03/21 2304 02/04/21 0402  BP: 105/66 111/62  Pulse: 79 75  Resp: 18 18  Temp: 36.5 C 36.7 C  SpO2: 94% 96%    Last Pain:  Vitals:   02/04/21 0458  TempSrc:   PainSc: 4                  Catalina Gravel

## 2021-02-04 NOTE — Op Note (Signed)
PATIENT NAME: Susan Berger   MEDICAL RECORD NO.:   [id number]    DATE OF BIRTH: 1960-10-19   DATE OF PROCEDURE: 02/04/2021                               OPERATIVE REPORT     PREOPERATIVE DIAGNOSES: 1.  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 Posterior spinal fusion, L2-3, L3-4, L4-5 2. Placement of posterior segmental instrumentation, extended to L5 bilaterally 3. Use of bone morphogenic protein and morselized allograft - Conform sheets 4. Exploration of spinal fusion, L2-3, L3-4 5. Intraoperative use of floroscopy   SURGEON:  Phylliss Bob, MD   ASSISTANT:  Pricilla Holm, PA-C   ANESTHESIA:  General endotracheal anesthesia.   COMPLICATIONS:  None.   DISPOSITION:  Stable.   ESTIMATED BLOOD LOSS:  Minimal   INDICATIONS FOR SURGERY: Briefly, Ms. Lair is one day status post anterior lumbar fusions as noted above.  Please refer to my operative report dated 02/03/2021, for a full account of the patient's preoperative history and indications for surgery.    Previously noted, she is also status post anterior fusions at L2-3 and L3-4.  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  The patient did present today for stage 2 of what was to be a 2-staged procedure.   OPERATIVE DETAILS:  On 02/04/2021, the patient was brought to surgery and general endotracheal anesthesia was administered.  The patient was placed prone onto a Jackson spinal bed.  The back was then prepped and draped in the usual sterile fashion.  I then made paramedian incisions on the right and left sides, just lateral to the lateral borders of  the pedicles of L2, L3, L4, and L5.  Bilaterally, the posterolateral gutter and posterior elements at the L2-3, L3-4, L4-5 levels were identified and exposed.   At this point, the caps overlying the pedicle screws at L2, L3, and L4 were removed, as were the interconnecting rods.  I did explore the fusion at L2-3 and L3-4, and there was micromotion noted across each level, consistent with the nonunion noted on the patient's CAT scan.  I then tapped the L5 pedicles bilaterally up to a 25m tap, and 7 x 461mscrews were placed bilaterally at L5.  At this point, 95 mm rods were secured into the tulip heads of the screws from L2-L5.  Caps were then placed and a final locking procedure was performed over each of the caps.  I then used a high-speed bur to decorticate the transverse processes of L2, L3, L4, and L5 bilaterally.  A medium sized BMP kit was utilized, and the collagen sponges, soaked BMP, replaced over the transverse processes from L2-L5.  Overlying this, allograft was placed over the BMP sponges, specifically, conform sheets were placed.  Prior to placing the BMP and bone graft, the wounds were copiously irrigated with abundant saline.  I did liberally use AP and lateral fluoroscopy throughout the surgery, and was very pleased with the final construct from L2-L5.  On the right and left sides, the fascia was closed using #1 Vicryl.  The  subcutaneous layer was closed using 0 Vicryl followed by 2-0 Vicryl, and the skin was then closed using 4-0 Monocryl. Benzoin and Steri-Strips were applied followed by sterile dressing.  All instrument counts were correct at the termination of the procedure.   Of note, Pricilla Holm was my assistant throughout surgery, and did aid in retraction, suctioning, placement of the hardware, and closure for both the entire procedure.      Phylliss Bob, MD

## 2021-02-04 NOTE — H&P (Signed)
Patient tolerated yesterday's procedure well. Will proceed today with stage 2 - a PSF from L2-L5 due to recent XLIF and nonunions.

## 2021-02-04 NOTE — Anesthesia Postprocedure Evaluation (Signed)
Anesthesia Post Note  Patient: Susan Berger  Procedure(s) Performed: LUMBAR 2-3, LUMBAR 3-4, LUMBAR 4-5 POSTERIOR SPINAL FUSION WITH INSTRUMENTATION AND ALLOGRAFT (N/A )     Patient location during evaluation: PACU Anesthesia Type: General Level of consciousness: awake and alert Pain management: pain level controlled Vital Signs Assessment: post-procedure vital signs reviewed and stable Respiratory status: spontaneous breathing, nonlabored ventilation and respiratory function stable Cardiovascular status: blood pressure returned to baseline and stable Postop Assessment: no apparent nausea or vomiting Anesthetic complications: no   No complications documented.  Last Vitals:  Vitals:   02/04/21 1130 02/04/21 1145  BP: 128/73 125/72  Pulse: 100 90  Resp: 19 20  Temp: 36.6 C 36.4 C  SpO2: 93% 95%                  Audry Pili

## 2021-02-04 NOTE — Transfer of Care (Signed)
Immediate Anesthesia Transfer of Care Note  Patient: Susan Berger  Procedure(s) Performed: LUMBAR 2-3, LUMBAR 3-4, LUMBAR 4-5 POSTERIOR SPINAL FUSION WITH INSTRUMENTATION AND ALLOGRAFT (N/A )  Patient Location: PACU  Anesthesia Type:General  Level of Consciousness: awake and drowsy  Airway & Oxygen Therapy: Patient Spontanous Breathing and Patient connected to face mask oxygen  Post-op Assessment: Report given to RN and Post -op Vital signs reviewed and stable  Post vital signs: Reviewed and stable  Last Vitals:  Vitals Value Taken Time  BP 148/73 02/04/21 1101  Temp    Pulse 91 02/04/21 1103  Resp 16 02/04/21 1103  SpO2 98 % 02/04/21 1103  Vitals shown include unvalidated device data.  Last Pain:  Vitals:   02/04/21 0458  TempSrc:   PainSc: 4       Patients Stated Pain Goal: 3 (43/83/81 8403)  Complications: No complications documented.

## 2021-02-04 NOTE — Progress Notes (Signed)
Patient awaiting transport for 2nd stage surgery by Dr Lynann Bologna this morning. Report given to Anesthesia. Patient  In  No signs of acute distress at this time.

## 2021-02-04 NOTE — Anesthesia Procedure Notes (Signed)
Procedure Name: Intubation Date/Time: 02/04/2021 7:41 AM Performed by: Inda Coke, CRNA Pre-anesthesia Checklist: Patient identified, Emergency Drugs available, Suction available and Patient being monitored Patient Re-evaluated:Patient Re-evaluated prior to induction Oxygen Delivery Method: Circle System Utilized Preoxygenation: Pre-oxygenation with 100% oxygen Induction Type: IV induction Ventilation: Mask ventilation without difficulty Laryngoscope Size: Mac and 3 Grade View: Grade I Tube type: Oral Tube size: 7.0 mm Number of attempts: 1 Airway Equipment and Method: Stylet and Oral airway Placement Confirmation: ETT inserted through vocal cords under direct vision,  positive ETCO2 and breath sounds checked- equal and bilateral Secured at: 21 cm Tube secured with: Tape Dental Injury: Teeth and Oropharynx as per pre-operative assessment

## 2021-02-05 ENCOUNTER — Encounter (HOSPITAL_COMMUNITY): Payer: Self-pay | Admitting: Orthopedic Surgery

## 2021-02-05 LAB — GLUCOSE, CAPILLARY: Glucose-Capillary: 103 mg/dL — ABNORMAL HIGH (ref 70–99)

## 2021-02-05 MED ORDER — METHOCARBAMOL 500 MG PO TABS: 500.0000 mg | ORAL_TABLET | Freq: Four times a day (QID) | ORAL | 1 refills | Status: AC | PRN

## 2021-02-05 MED ORDER — HYDROCODONE-ACETAMINOPHEN 7.5-325 MG PO TABS: 1.0000 | ORAL_TABLET | ORAL | 0 refills | Status: AC | PRN

## 2021-02-05 MED FILL — Thrombin (Recombinant) For Soln 20000 Unit: CUTANEOUS | Qty: 2 | Status: AC

## 2021-02-05 NOTE — Progress Notes (Signed)
Patient is discharged from room 3C09 a this time. Alert and in stable condition. IV site d/c'd and instructions read to patient and spouse with understanding verbalized and all questions answered. Left unit via wheelchair with all belongings at side.

## 2021-02-05 NOTE — Progress Notes (Signed)
    Patient doing well  Denies leg pain   Physical Exam: Vitals:   02/04/21 2257 02/05/21 0308  BP: (!) 100/51 109/61  Pulse: 75 83  Resp: 18 18  Temp: 97.9 F (36.6 C) 98.1 F (36.7 C)  SpO2: 96% 95%    Dressing in place NVI  POD #1 s/p A/P fusion, doing well  - up with PT/OT, encourage ambulation - Norco for pain, Robaxin for muscle spasms - d/c home today with f/u in 2 weeks

## 2021-02-05 NOTE — Evaluation (Signed)
Occupational Therapy Evaluation and Discharge Patient Details Name: Susan Berger MRN: [id number] DOB: [birth year] Today's Date: 02/05/2021    History of Present Illness Pt is a 60 yo female s/p posterior spinal fusion, L2-3, L3-4, L4-5. PHMx: ALIF/PLIF L2-3, L3-21 Nov 2019, COPD, HA, anxiety, and DM   Clinical Impression   This 60 yo female admitted and underwent above presents to acute OT with all education completed, we will D/C from acute OT.    Follow Up Recommendations  No OT follow up;Supervision - Intermittent    Equipment Recommendations  None recommended by OT       Precautions / Restrictions Precautions Precautions: Back Precaution Booklet Issued: Yes (comment) Required Braces or Orthoses: Spinal Brace Spinal Brace: Applied in sitting position Restrictions Weight Bearing Restrictions: No      Mobility Bed Mobility Overal bed mobility: Modified Independent                  Transfers Overall transfer level: Needs assistance Equipment used: 4-wheeled walker Transfers: Sit to/from Stand Sit to Stand: Supervision         General transfer comment: VCs for sit<>stand technique to keep back straight as possible    Balance Overall balance assessment: Mild deficits observed, not formally tested                                         ADL either performed or assessed with clinical judgement   ADL Overall ADL's : Needs assistance/impaired Eating/Feeding: Independent;Sitting     Grooming Details (indicate cue type and reason): Educated on use of two cups for brushing teeth to avoid bending over sink Upper Body Bathing: Independent;Sitting   Lower Body Bathing: Minimal assistance Lower Body Bathing Details (indicate cue type and reason): S sit<>stand Upper Body Dressing : Set up;Sitting   Lower Body Dressing: Minimal assistance Lower Body Dressing Details (indicate cue type and reason): S sit<>stand Toilet Transfer:  Supervision/safety;Ambulation Toilet Transfer Details (indicate cue type and reason): rollator, educated on sit<>stand technique that makes it easier to keep back straight Toileting- Clothing Manipulation and Hygiene: Independent Toileting - Clothing Manipulation Details (indicate cue type and reason): S sit<>stand, educated on use of wet wipes to use so as to avoid not twisting as much             Vision Patient Visual Report: No change from baseline              Pertinent Vitals/Pain Pain Assessment: 0-10 Pain Score: 6  Pain Location: incisional Pain Descriptors / Indicators: Sore Pain Intervention(s): Limited activity within patient's tolerance;Monitored during session;Repositioned     Hand Dominance Right   Extremity/Trunk Assessment Upper Extremity Assessment Upper Extremity Assessment: Overall WFL for tasks assessed           Communication Communication Communication: No difficulties   Cognition Arousal/Alertness: Awake/alert Behavior During Therapy: WFL for tasks assessed/performed Overall Cognitive Status: Within Functional Limits for tasks assessed                                                Home Living Family/patient expects to be discharged to:: Private residence Living Arrangements: Spouse/significant other Available Help at Discharge: Family;Friend(s);Available 24 hours/day Type of Home: House Home Access: Ramped entrance  Home Layout: One level;Laundry or work area in basement     [employer]: Agricultural consultant: Handicapped [place]: [place] held shower head;Grab bars - tub/shower;Walker - 4 wheels          Prior Functioning/Environment Level of Independence: Independent                 OT Problem List: Decreased range of motion;Pain         OT Goals(Current goals can be found in the care plan section) Acute Rehab OT Goals Patient Stated Goal:  home today  OT Frequency:                AM-PAC OT "6 Clicks" Daily Activity     Outcome Measure Help from another person eating meals?: None Help from another person taking care of personal grooming?: A Little Help from another person toileting, which includes using toliet, bedpan, or urinal?: A Little Help from another person bathing (including washing, rinsing, drying)?: A Little Help from another person to put on and taking off regular upper body clothing?: A Little Help from another person to put on and taking off regular lower body clothing?: A Little 6 Click Score: 19   End of Session Equipment Utilized During Treatment: Rolling walker;Back brace Nurse Communication:  (no OT needs written on communication paper at desk)  Activity Tolerance: Patient tolerated treatment well Patient left:  (with PT ambulating down hallway)  OT Visit Diagnosis: Unsteadiness on feet (R26.81);Other abnormalities of gait and mobility (R26.89);Pain Pain - part of body:  (incisional)                Time: 8850-2774 OT Time Calculation (min): 27 min Charges:  OT General Charges $OT Visit: 1 Visit OT Evaluation $OT Eval Moderate Complexity: 1 Mod OT Treatments $Self Care/Home Management : 8-22 mins  [place], OTR/L Acute [employer] Pager [phone] Office [phone]     Almon Register 02/05/2021, 8:27 AM

## 2021-02-05 NOTE — Evaluation (Signed)
Physical Therapy Evaluation Patient Details Name: Susan Berger MRN: [id number] DOB: 08/30/1961 Today's Date: 02/05/2021   History of Present Illness  Pt is a 60 y/o female who presents s/p 2 stage fusion - L4-L5 ALIF on 02/03/21, and L2-L5 PLIF on 02/04/21. PHM: ALIF/PLIF L2-3, L3-21 Nov 2019, COPD, HA, anxiety, and DM    Clinical Impression  Pt admitted with above diagnosis. At the time of PT eval, pt was able to demonstrate transfers and ambulation with gross supervision for safety and rollator for support. Pt was educated on precautions, brace application/wearing schedule, appropriate activity progression, and car transfer. Pt currently with functional limitations due to the deficits listed below (see PT Problem List). Pt will benefit from skilled PT to increase their independence and safety with mobility to allow discharge to the venue listed below.     Follow Up Recommendations No PT follow up;Supervision for mobility/OOB    Equipment Recommendations  None recommended by PT    Recommendations for Other Services       Precautions / Restrictions Precautions Precautions: Back Precaution Booklet Issued: Yes (comment) Required Braces or Orthoses: Spinal Brace Spinal Brace: Applied in sitting position Restrictions Weight Bearing Restrictions: No      Mobility  Bed Mobility Overal bed mobility: Modified Independent             General bed mobility comments: Pt was received exiting room with OT. Verbally reviewed log roll technique.    Transfers Overall transfer level: Needs assistance Equipment used: 4-wheeled walker Transfers: Sit to/from Stand Sit to Stand: Supervision         General transfer comment: VC's for improved posture, wide BOS, and hand placement on seated surface for safety.  Ambulation/Gait Ambulation/Gait assistance: Supervision Gait Distance (Feet): 250 Feet Assistive device: 4-wheeled walker Gait Pattern/deviations: Step-through  pattern;Decreased stride length;Trunk flexed Gait velocity: Decreased Gait velocity interpretation: <1.8 ft/sec, indicate of risk for recurrent falls General Gait Details: VC's for improved posture and closer walker proximity throughout gait training. No unsteadiness or LOB noted.  Stairs            Wheelchair Mobility    Modified Rankin (Stroke Patients Only)       Balance Overall balance assessment: Mild deficits observed, not formally tested                                           Pertinent Vitals/Pain Pain Assessment: 0-10 Pain Score: 7  Pain Location: incisional Pain Descriptors / Indicators: Sore Pain Intervention(s): Limited activity within patient's tolerance;Monitored during session;Repositioned    Home Living Family/patient expects to be discharged to:: Private residence Living Arrangements: Spouse/significant other Available Help at Discharge: Family;Friend(s);Available 24 hours/day Type of Home: House Home Access: Ramped entrance     Home Layout: One level;Laundry or work area in Friend: [place] held shower head;Grab bars - tub/shower;Walker - 4 wheels      Prior Function Level of Independence: Independent               Hand Dominance   Dominant Hand: Right    Extremity/Trunk Assessment   Upper Extremity Assessment Upper Extremity Assessment: Defer to OT evaluation    Lower Extremity Assessment Lower Extremity Assessment: Generalized weakness (Consistent with pre-op diagnosis)    Cervical / Trunk Assessment Cervical / Trunk Assessment: Other exceptions Cervical / Trunk Exceptions: s/p surgery  Communication  Communication: No difficulties  Cognition Arousal/Alertness: Awake/alert Behavior During Therapy: WFL for tasks assessed/performed Overall Cognitive Status: Within Functional Limits for tasks assessed                                        General Comments       Exercises     Assessment/Plan    PT Assessment Patient needs continued PT services  PT Problem List Decreased strength;Decreased activity tolerance;Decreased balance;Decreased mobility;Decreased knowledge of use of DME;Decreased safety awareness;Decreased knowledge of precautions;Pain       PT Treatment Interventions DME instruction;Gait training;Functional mobility training;Therapeutic activities;Therapeutic exercise;Neuromuscular re-education;Patient/family education    PT Goals (Current goals can be found in the Care Plan section)  Acute Rehab PT Goals Patient Stated Goal: home today PT Goal Formulation: With patient Time For Goal Achievement: 02/12/21 Potential to Achieve Goals: Good    Frequency Min 5X/week   Barriers to discharge        Co-evaluation               AM-PAC PT "6 Clicks" Mobility  Outcome Measure Help needed turning from your back to your side while in a flat bed without using bedrails?: None Help needed moving from lying on your back to sitting on the side of a flat bed without using bedrails?: None Help needed moving to and from a bed to a chair (including a wheelchair)?: A Little Help needed standing up from a chair using your arms (e.g., wheelchair or bedside chair)?: A Little Help needed to walk in hospital room?: A Little Help needed climbing 3-5 steps with a railing? : A Little 6 Click Score: 20    End of Session Equipment Utilized During Treatment: Back brace Activity Tolerance: Patient tolerated treatment well Patient left: in chair;with call bell/phone within reach Nurse Communication: Mobility status PT Visit Diagnosis: Unsteadiness on feet (R26.81);Pain Pain - part of body:  (back)    Time: 0812-0829 PT Time Calculation (min) (ACUTE ONLY): 17 min   Charges:   PT Evaluation $PT Eval Low Complexity: 1 Low          Susan Berger, PT, DPT Acute Rehabilitation Services Pager: [phone] Office: [phone]   Thelma Comp 02/05/2021, 9:48 AM

## 2021-02-11 NOTE — Discharge Summary (Signed)
Patient ID: Susan Berger MRN: [id number] DOB/AGE: September 15, 1961 60 y.o.  Admit date: 02/03/2021 Discharge date: 02/05/2021  Admission Diagnoses:  Active Problems:   Radiculopathy   Discharge Diagnoses:  Same  Past Medical History:  Diagnosis Date  . Allergy   . Anxiety   . Anxiety   . Arthritis   . Borderline diabetes   . Constipation    uses magnesium daily and with this stools are sofe  . COPD (chronic obstructive pulmonary disease) (HCC)   . Depression   . Diabetes mellitus    Type II  . History of kidney stones   . Hyperlipidemia   . Migraine    hasn't had one in 1 year  . Multiple allergies    allergy vac 3 days a week    Surgeries: Procedure(s): LUMBAR 2-3, LUMBAR 3-4, LUMBAR 4-5 ANTERIOR POSTERIOR SPINAL FUSION WITH INSTRUMENTATION AND ALLOGRAFT on 02/04/2021   Consultants: none  Discharged Condition: Improved  Hospital Course: Susan Berger is an 60 y.o. female who was admitted 02/03/2021 for operative treatment of radiculopathy. Patient has severe unremitting pain that affects sleep, daily activities, and work/hobbies. After pre-op clearance the patient was taken to the operating room on 02/04/2021 and underwent  Procedure(s): LUMBAR 2-3, LUMBAR 3-4, LUMBAR 4-5 POSTERIOR SPINAL FUSION WITH INSTRUMENTATION AND ALLOGRAFT.    Patient was given perioperative antibiotics:  Anti-infectives (From admission, onward)   Start     Dose/Rate Route Frequency Ordered Stop   02/04/21 0728  ceFAZolin (ANCEF) 2-4 GM/100ML-% IVPB       Note to Pharmacy: Stann Ore   : cabinet override      02/04/21 0728 02/04/21 1944   02/04/21 0600  ceFAZolin (ANCEF) IVPB 2g/100 mL premix  Status:  Discontinued        2 g 200 mL/hr over 30 Minutes Intravenous On call to O.R. 02/04/21 0215 02/04/21 0216   02/03/21 1700  ceFAZolin (ANCEF) IVPB 2g/100 mL premix        2 g 200 mL/hr over 30 Minutes Intravenous Every 8 hours 02/03/21 1307 02/04/21 1219   02/03/21 0700   ceFAZolin (ANCEF) IVPB 2g/100 mL premix        2 g 200 mL/hr over 30 Minutes Intravenous On call to O.R. 02/03/21 5621 02/03/21 0915       Patient was given sequential compression devices, early ambulation to prevent DVT.  Patient benefited maximally from hospital stay and there were no complications.    Recent vital signs:BP 98/67 (BP Location: Left Arm)   Pulse 94   Temp 98.4 F (36.9 C) (Oral)   Resp 16   Ht 5\' 5"  (1.651 m)   Wt 62.1 kg   SpO2 92%   BMI 22.80 kg/m    Discharge Medications:   Allergies as of 02/05/2021   No Known Allergies     Medication List    TAKE these medications   albuterol 108 (90 Base) MCG/ACT inhaler Commonly known as: VENTOLIN HFA INHALE 2 PUFFS INTO THE LUNGS EVERY 6 HOURS AS NEEDED FOR WHEEZING What changed: See the new instructions.   atorvastatin 80 MG tablet Commonly known as: LIPITOR Take 80 mg by mouth every evening.   cyclobenzaprine 5 MG tablet Commonly known as: FLEXERIL Take 5-10 mg by mouth at bedtime as needed for muscle spasms.   Cymbalta 60 MG capsule Generic drug: DULoxetine Take 60 mg by mouth every evening.   HYDROcodone-acetaminophen 7.5-325 MG tablet Commonly known as: NORCO Take 1 tablet by mouth  every 4 (four) hours as needed for moderate pain ((score 4 to 6)).   loratadine 10 MG tablet Commonly known as: CLARITIN Take 10 mg by mouth daily as needed for allergies.   magnesium oxide 400 MG tablet Commonly known as: MAG-OX Take 400 mg by mouth every evening.   metFORMIN 500 MG tablet Commonly known as: GLUCOPHAGE Take 500 mg by mouth in the morning and at bedtime.   methocarbamol 500 MG tablet Commonly known as: ROBAXIN Take 1 tablet (500 mg total) by mouth every 6 (six) hours as needed for muscle spasms. What changed: Another medication with the same name was added. Make sure you understand how and when to take each.   methocarbamol 500 MG tablet Commonly known as: ROBAXIN Take 1 tablet (500 mg  total) by mouth every 6 (six) hours as needed for muscle spasms. What changed: You were already taking a medication with the same name, and this prescription was added. Make sure you understand how and when to take each.   Neurontin 300 MG capsule Generic drug: gabapentin Take 300 mg by mouth 3 (three) times daily.   Stiolto Respimat 2.5-2.5 MCG/ACT Aers Generic drug: Tiotropium Bromide-Olodaterol INHALE 2 PUFFS BY MOUTH INTO THE LUNGS DAILY What changed: See the new instructions.   traMADol 50 MG tablet Commonly known as: ULTRAM Take 50 mg by mouth 3 (three) times daily as needed for severe pain.       Diagnostic Studies: DG Lumbar Spine 2-3 Views  Result Date: 02/04/2021 CLINICAL DATA:  Provided history: L4-5 posterior fusion.  Provided FLUOROSCOPY TIME:  1 minutes, 40 seconds (75.60 mGy). EXAM: LUMBAR SPINE - 2-3 VIEW; DG C-ARM 1-60 MIN COMPARISON:  Intraoperative fluoroscopic images of the lumbar spine yesterday 12/06/2020. CT of the lumbar spine 01/16/2021. FINDINGS: AP and lateral view intraoperative fluoroscopic images of the lumbar spine are submitted, 3 images total. The provided images demonstrate a posterior spinal fusion construct (bilateral pedicle screws and vertical interconnecting rods) now spanning the L2-L5 levels. Interbody devices are present at L2-L3, L3-L4 and L4-L5. The L2-L3 and L3-L4 interbody devices are slightly eccentric to the left. IMPRESSION: Three intraoperative fluoroscopic images of the lumbar spine, as described. Electronically Signed   By: Kellie Simmering DO   On: 02/04/2021 10:42   DG Lumbar Spine 2-3 Views  Result Date: 02/03/2021 CLINICAL DATA:  Lumbar fusion. EXAM: LUMBAR SPINE - 2-3 VIEW; DG C-ARM 1-60 MIN COMPARISON:  CT 01/16/2021. FINDINGS: Postsurgical changes noted of the lumbar spine with multilevel fusion. Visualized hardware intact. Stable appearing alignment. No radiopaque foreign body identified. IMPRESSION: Postsurgical changes lumbar spine.   Visualized hardware intact. Electronically Signed   By: Marcello Moores  Register   On: 02/03/2021 11:08   CT LUMBAR SPINE WO CONTRAST  Result Date: 01/18/2021 CLINICAL DATA:  Low back pain. EXAM: CT LUMBAR SPINE WITHOUT CONTRAST TECHNIQUE: Multidetector CT imaging of the lumbar spine was performed without intravenous contrast administration. Multiplanar CT image reconstructions were also generated. COMPARISON:  Fluoroscopy Mar 12, 2020. Radiographs August 25, 2020. MRI lumbar spine September 02, 2020. FINDINGS: Segmentation: Standard segmentation is assumed. Inferior-most fully formed intervertebral disc labeled L5-S1. This numbering system is consistent with prior MRI. Alignment: Approximately 3 mm of retrolisthesis of L2 on L3 and 2 mm retrolisthesis of L1 on L2, not substantially changed from prior. Lumbar dextrocurvature with compensatory partially imaged lower thoracic levocurvature. Vertebrae: Posterior rod and pedicle screw fixation spanning L2-L4. Intervening spacers at L2-L3 and L3-L4. No lucency around the hardware to suggest loosening.  No evidence of hardware fracture. No evidence of osseous fusion across the disc spaces. Vertebral body heights are maintained. No evidence of acute fracture. Paraspinal and other soft tissues: Calcific atherosclerosis of the aorta. No acute findings. Disc levels: Moderate to severe degenerative disc disease at T11-T12, L1-L2 and L4-L5 with disc height loss, endplate sclerosis, vacuum disc phenomenon. Findings are worse on the right at T11-T12 and L1-L2 in the left at L4-L5. At L1-L2 right eccentric disc bulge narrows the right subarticular recess, likely similar to prior MRI but better characterized on that study. At L2-L3, prior laminectomy. No evidence of significant residual bony canal stenosis. At L3-L4, suspected small right foraminal disc protrusion, likely similar to prior MRI but better characterized on that study. No evidence of significant residual bony canal stenosis.  At L4-L5, left eccentric disc bulge with endplate spurring and suspected left foraminal disc protrusion. Resulting left foraminal stenosis, better characterized on prior MRI. Left subarticular recess stenosis. Central canal appears patent by CT. At L5-S1, no evidence of significant bony canal or foraminal stenosis. IMPRESSION: 1. L2-L4 PLIF without evidence of osseous fusion across the disc spaces. 2. Multilevel degenerative change (detailed above), including suspected left foraminal and subarticular recess stenosis at L4-L5 that was better characterized on prior MRI. Repeat MRI could better evaluate for change if clinically indicated. Electronically Signed   By: Margaretha Sheffield MD   On: 01/18/2021 08:51   DG Lumbar Spine 1 View  Result Date: 02/04/2021 CLINICAL DATA:  Surgery, elective.  L4-5 posterior fusion. EXAM: LUMBAR SPINE - 1 VIEW COMPARISON:  Intraoperative fluoroscopic images of the lumbar spine yesterday 02/03/2021. CT of the lumbar spine 01/16/2021. FINDINGS: A single lateral view intraoperative radiograph of the lumbar spine is submitted. On the provided images, a posterior spinal fusion construct is present at the L2-L4 levels (bilateral pedicle screws and vertical interconnecting rods). Interbody devices are present at L2-L3, L3-L4 and L4-L5. A small hyperdense focus overlies the skin surface, posterior to the L5 spinous process, which may reflect a site marker. L2-L3 grade 1 retrolisthesis. IMPRESSION: Single lateral view intraoperative localizer radiograph of the lumbar spine, as described. Electronically Signed   By: Kellie Simmering DO   On: 02/04/2021 10:44   DG C-Arm 1-60 Min  Result Date: 02/04/2021 CLINICAL DATA:  Provided history: L4-5 posterior fusion.  Provided FLUOROSCOPY TIME:  1 minutes, 40 seconds (75.60 mGy). EXAM: LUMBAR SPINE - 2-3 VIEW; DG C-ARM 1-60 MIN COMPARISON:  Intraoperative fluoroscopic images of the lumbar spine yesterday 12/06/2020. CT of the lumbar spine 01/16/2021.  FINDINGS: AP and lateral view intraoperative fluoroscopic images of the lumbar spine are submitted, 3 images total. The provided images demonstrate a posterior spinal fusion construct (bilateral pedicle screws and vertical interconnecting rods) now spanning the L2-L5 levels. Interbody devices are present at L2-L3, L3-L4 and L4-L5. The L2-L3 and L3-L4 interbody devices are slightly eccentric to the left. IMPRESSION: Three intraoperative fluoroscopic images of the lumbar spine, as described. Electronically Signed   By: Kellie Simmering DO   On: 02/04/2021 10:42   DG C-Arm 1-60 Min  Result Date: 02/03/2021 CLINICAL DATA:  Lumbar fusion. EXAM: LUMBAR SPINE - 2-3 VIEW; DG C-ARM 1-60 MIN COMPARISON:  CT 01/16/2021. FINDINGS: Postsurgical changes noted of the lumbar spine with multilevel fusion. Visualized hardware intact. Stable appearing alignment. No radiopaque foreign body identified. IMPRESSION: Postsurgical changes lumbar spine.  Visualized hardware intact. Electronically Signed   By: Marcello Moores  Register   On: 02/03/2021 11:08    Disposition: Discharge disposition: 01-Home  or Self Care        POD #1 s/p A/P fusion, doing well  - up with PT/OT, encourage ambulation - Norco for pain, Robaxin for muscle spasms - d/c home today with f/u in 2 weeks -Scripts for pain sent to pharmacy electronically  -D/C instructions sheet printed and in chart -D/C today  -F/U in office 2 weeks   Signed: Lennie Muckle Briawna Carver 02/11/2021, 1:04 PM

## 2021-02-25 ENCOUNTER — Other Ambulatory Visit: Payer: Self-pay | Admitting: *Deleted

## 2021-02-25 DIAGNOSIS — Z87891 Personal history of nicotine dependence: Secondary | ICD-10-CM

## 2021-04-04 ENCOUNTER — Encounter (HOSPITAL_COMMUNITY): Payer: Self-pay | Admitting: Orthopedic Surgery

## 2021-04-04 NOTE — OR Nursing (Signed)
Implant log corrected to show the correct serial numbers, [employer] item number, and to complete the allograft log requirements.

## 2021-04-14 ENCOUNTER — Other Ambulatory Visit: Payer: Self-pay

## 2021-04-14 ENCOUNTER — Ambulatory Visit (INDEPENDENT_AMBULATORY_CARE_PROVIDER_SITE_OTHER): Payer: 59 | Admitting: Acute Care

## 2021-04-14 ENCOUNTER — Encounter: Payer: Self-pay | Admitting: Acute Care

## 2021-04-14 ENCOUNTER — Ambulatory Visit
Admission: RE | Admit: 2021-04-14 | Discharge: 2021-04-14 | Disposition: A | Discharge status: Home / Self Care | Payer: 59 | Source: Ambulatory Visit | Attending: Acute Care | Admitting: Acute Care

## 2021-04-14 VITALS — BP 102/62 | HR 93 | Temp 97.3°F | Ht 64.0 in | Wt 142.7 lb

## 2021-04-14 DIAGNOSIS — Z87891 Personal history of nicotine dependence: Secondary | ICD-10-CM | POA: Diagnosis not present

## 2021-04-14 NOTE — Patient Instructions (Signed)
Thank you for participating in the [facility]. It was our pleasure to meet you today. We will call you with the results of your scan within the next few days. Your scan will be assigned a Lung RADS category score by the physicians reading the scans.  This Lung RADS score determines follow up scanning.  See below for description of categories, and follow up screening recommendations. We will be in touch to schedule your follow up screening annually or based on recommendations of our providers. We will fax a copy of your scan results to your Primary Care Physician, or the physician who referred you to the program, to ensure they have the results. Please call the office if you have any questions or concerns regarding your scanning experience or results.  Our office number is [phone]. Please speak with Denise Phelps, RN. She is our Lung Cancer Screening RN. If she is unavailable when you call, please have the office staff send her a message. She will return your call at her earliest convenience. Remember, if your scan is normal, we will scan you annually as long as you continue to meet the criteria for the program. (Age 55-77, Current smoker or smoker who has quit within the last 15 years). If you are a smoker, remember, quitting is the single most powerful action that you can take to decrease your risk of lung cancer and other pulmonary, breathing related problems. We know quitting is hard, and we are here to help.  Please let us know if there is anything we can do to help you meet your goal of quitting. If you are a former smoker, congratulations. We are proud of you! Remain smoke free! Remember you can refer friends or family members through the number above.  We will screen them to make sure they meet criteria for the program. Thank you for helping us take better care of you by participating in Lung Screening.  Lung RADS Categories:  Lung RADS 1: no nodules  or definitely non-concerning nodules.  Recommendation is for a repeat annual scan in 12 months.  Lung RADS 2:  nodules that are non-concerning in appearance and behavior with a very low likelihood of becoming an active cancer. Recommendation is for a repeat annual scan in 12 months.  Lung RADS 3: nodules that are probably non-concerning , includes nodules with a low likelihood of becoming an active cancer.  Recommendation is for a 6-month repeat screening scan. Often noted after an upper respiratory illness. We will be in touch to make sure you have no questions, and to schedule your 6-month scan.  Lung RADS 4 A: nodules with concerning findings, recommendation is most often for a follow up scan in 3 months or additional testing based on our provider's assessment of the scan. We will be in touch to make sure you have no questions and to schedule the recommended 3 month follow up scan.  Lung RADS 4 B:  indicates findings that are concerning. We will be in touch with you to schedule additional diagnostic testing based on our provider's  assessment of the scan.   

## 2021-04-14 NOTE — Progress Notes (Signed)
Shared Decision Making Visit Lung Cancer Screening Program [phone])   Eligibility: Age 60 y.o. Pack Years Smoking History Calculation 33 pack year smoking history (# packs/per year x # years smoked) Recent History of coughing up blood  no Unexplained weight loss? no ( >Than 15 pounds within the last 6 months ) Prior History Lung / other cancer no (Diagnosis within the last 5 years already requiring surveillance chest CT Scans). Smoking Status Former Smoker Former Smokers: Years since quit: 10 years  Quit Date: 2012  Visit Components: Discussion included one or more decision making aids. yes Discussion included risk/benefits of screening. yes Discussion included potential follow up diagnostic testing for abnormal scans. yes Discussion included meaning and risk of over diagnosis. yes Discussion included meaning and risk of False Positives. yes Discussion included meaning of total radiation exposure. yes  Counseling Included: Importance of adherence to annual lung cancer LDCT screening. yes Impact of comorbidities on ability to participate in the program. yes Ability and willingness to under diagnostic treatment. yes  Smoking Cessation Counseling: Current Smokers:  Discussed importance of smoking cessation. yes Information about tobacco cessation classes and interventions provided to patient. yes Patient provided with "ticket" for LDCT Scan. yes Symptomatic Patient. no  Counseling Diagnosis Code: Tobacco Use Z72.0 Asymptomatic Patient yes  Counseling (Intermediate counseling: > three minutes counseling) [id number] Former Smokers:  Discussed the importance of maintaining cigarette abstinence. yes Diagnosis Code: Personal History of Nicotine Dependence. W29.562 Information about tobacco cessation classes and interventions provided to patient. Yes Patient provided with "ticket" for LDCT Scan. yes Written Order for Lung Cancer Screening with LDCT placed in Epic. Yes (CT Chest Lung  Cancer Screening Low Dose W/O CM) [id number] Z12.2-Screening of respiratory organs Z87.891-Personal history of nicotine dependence  I spent 25 minutes of face to face time with Ms. Moragne discussing the risks and benefits of lung cancer screening. We viewed a power point together that explained in detail the above noted topics. We took the time to pause the power point at intervals to allow for questions to be asked and answered to ensure understanding. We discussed that she had taken the single most powerful action possible to decrease her risk of developing lung cancer when she quit smoking. I counseled her to remain smoke free, and to contact me if she ever had the desire to smoke again so that I can provide resources and tools to help support the effort to remain smoke free. We discussed the time and location of the scan, and that either  Doroteo Glassman RN or I will call with the results within  24-48 hours of receiving them. She has my card and contact information in the event she needs to speak with me, in addition to a copy of the power point we reviewed as a resource. She verbalized understanding of all of the above and had no further questions upon leaving the office.   BP 102/62 (BP Location: Left Arm, Cuff Size: Normal)   Pulse 93   Temp (!) 97.3 F (36.3 C) (Oral)   Ht 5\' 4"  (1.626 m)   Wt 142 lb 11.2 oz (64.7 kg)   SpO2 95%   BMI 24.49 kg/m    I explained to the patient that there has been a high incidence of coronary artery disease noted on these exams. I explained that this is a non-gated exam therefore degree or severity cannot be determined. This patient is currently on statin therapy. I have asked the patient to follow-up with their PCP  regarding any incidental finding of coronary artery disease and management with diet or medication as they feel is clinically indicated. The patient verbalized understanding of the above and had no further questions.     Magdalen Spatz,  NP 04/14/2021

## 2021-04-16 NOTE — Progress Notes (Signed)

## 2021-04-20 ENCOUNTER — Other Ambulatory Visit: Payer: Self-pay | Admitting: *Deleted

## 2021-04-20 DIAGNOSIS — Z87891 Personal history of nicotine dependence: Secondary | ICD-10-CM

## 2021-07-10 ENCOUNTER — Other Ambulatory Visit: Payer: Self-pay | Admitting: Emergency Medicine

## 2022-04-14 ENCOUNTER — Ambulatory Visit
Admission: RE | Admit: 2022-04-14 | Discharge: 2022-04-14 | Disposition: A | Discharge status: Home / Self Care | Payer: 59 | Source: Ambulatory Visit | Attending: Internal Medicine | Admitting: Internal Medicine

## 2022-04-14 DIAGNOSIS — Z87891 Personal history of nicotine dependence: Secondary | ICD-10-CM

## 2022-04-18 ENCOUNTER — Other Ambulatory Visit: Payer: Self-pay

## 2022-04-18 DIAGNOSIS — Z122 Encounter for screening for malignant neoplasm of respiratory organs: Secondary | ICD-10-CM

## 2022-04-18 DIAGNOSIS — Z87891 Personal history of nicotine dependence: Secondary | ICD-10-CM

## 2022-07-25 ENCOUNTER — Other Ambulatory Visit: Payer: Self-pay | Admitting: Emergency Medicine

## 2023-03-14 ENCOUNTER — Other Ambulatory Visit: Payer: Self-pay | Admitting: Acute Care

## 2023-03-14 DIAGNOSIS — Z122 Encounter for screening for malignant neoplasm of respiratory organs: Secondary | ICD-10-CM

## 2023-03-14 DIAGNOSIS — Z87891 Personal history of nicotine dependence: Secondary | ICD-10-CM

## 2023-04-21 ENCOUNTER — Ambulatory Visit
Admission: RE | Admit: 2023-04-21 | Discharge: 2023-04-21 | Disposition: A | Discharge status: Home / Self Care | Payer: 59 | Source: Ambulatory Visit | Attending: Acute Care | Admitting: Acute Care

## 2023-04-21 DIAGNOSIS — Z122 Encounter for screening for malignant neoplasm of respiratory organs: Secondary | ICD-10-CM

## 2023-04-21 DIAGNOSIS — Z87891 Personal history of nicotine dependence: Secondary | ICD-10-CM

## 2023-04-24 ENCOUNTER — Other Ambulatory Visit: Payer: Self-pay | Admitting: Acute Care

## 2023-04-24 DIAGNOSIS — Z122 Encounter for screening for malignant neoplasm of respiratory organs: Secondary | ICD-10-CM

## 2023-04-24 DIAGNOSIS — Z87891 Personal history of nicotine dependence: Secondary | ICD-10-CM

## 2023-08-03 ENCOUNTER — Other Ambulatory Visit: Payer: Self-pay | Admitting: Emergency Medicine

## 2023-09-08 ENCOUNTER — Other Ambulatory Visit: Payer: Self-pay | Admitting: Neurosurgery

## 2023-09-08 DIAGNOSIS — G8929 Other chronic pain: Secondary | ICD-10-CM

## 2023-09-19 NOTE — Discharge Instructions (Signed)

## 2023-09-20 ENCOUNTER — Encounter: Payer: Self-pay | Admitting: Neurosurgery

## 2023-09-20 ENCOUNTER — Ambulatory Visit
Admission: RE | Admit: 2023-09-20 | Discharge: 2023-09-20 | Disposition: A | Discharge status: Home / Self Care | Payer: 59 | Source: Ambulatory Visit | Attending: Neurosurgery | Admitting: Neurosurgery

## 2023-09-20 ENCOUNTER — Inpatient Hospital Stay
Admission: RE | Admit: 2023-09-20 | Discharge: 2023-09-20 | Disposition: A | Discharge status: Home / Self Care | Payer: 59 | Source: Ambulatory Visit | Attending: Neurosurgery | Admitting: Neurosurgery

## 2023-09-20 DIAGNOSIS — G8929 Other chronic pain: Secondary | ICD-10-CM

## 2023-09-20 MED ORDER — IOPAMIDOL (ISOVUE-M 200) INJECTION 41%
20.0000 mL | Freq: Once | INTRAMUSCULAR | Status: AC
  Administered 2023-09-20: 20 mL via INTRATHECAL

## 2023-09-20 MED ORDER — ONDANSETRON HCL 4 MG/2ML IJ SOLN: 4.0000 mg | Freq: Once | INTRAMUSCULAR | Status: DC | PRN

## 2023-09-20 MED ORDER — DIAZEPAM 5 MG PO TABS
10.0000 mg | ORAL_TABLET | Freq: Once | ORAL | Status: AC
  Administered 2023-09-20: 10 mg via ORAL

## 2023-09-20 MED ORDER — MEPERIDINE HCL 50 MG/ML IJ SOLN: 50.0000 mg | Freq: Once | INTRAMUSCULAR | Status: DC | PRN

## 2024-04-22 ENCOUNTER — Ambulatory Visit
Admission: RE | Admit: 2024-04-22 | Discharge: 2024-04-22 | Disposition: A | Discharge status: Home / Self Care | Source: Ambulatory Visit | Attending: Acute Care | Admitting: Acute Care

## 2024-04-22 DIAGNOSIS — Z122 Encounter for screening for malignant neoplasm of respiratory organs: Secondary | ICD-10-CM

## 2024-04-22 DIAGNOSIS — Z87891 Personal history of nicotine dependence: Secondary | ICD-10-CM

## 2024-04-30 ENCOUNTER — Telehealth: Payer: Self-pay | Admitting: Acute Care

## 2024-04-30 DIAGNOSIS — R911 Solitary pulmonary nodule: Secondary | ICD-10-CM

## 2024-04-30 NOTE — Telephone Encounter (Signed)
 Left Vm to call for review of results of LDCT.  LR3

## 2024-05-01 NOTE — Addendum Note (Signed)
 Addended by: RHETT KELLY POUR on: 05/01/2024 10:16 AM   Modules accepted: Orders

## 2024-05-01 NOTE — Telephone Encounter (Signed)
 Patient returned call. Spoke with pt and informed her of the results. We discussed the new 5.57mm nodule and the need for a 6 month follow up scan. Patient verbalized understanding and agreeable to scan, order placed. Patient aware of emphysema and CAD, on stating therapy. Will forward results to PCP.    IMPRESSION: Lung-RADS 3, probably benign findings. Short-term follow-up in 6 months is recommended with repeat low-dose chest CT without contrast (please use the following order, CT CHEST LCS NODULE FOLLOW-UP W/O CM).   Moderate coronary artery calcification.  Moderate emphysema.   Emphysema (ICD10-J43.9).     Electronically Signed   By: Dorethia Molt M.D.   On: 04/30/2024 01:58

## 2024-07-29 ENCOUNTER — Other Ambulatory Visit: Payer: Self-pay | Admitting: Emergency Medicine

## 2024-07-31 ENCOUNTER — Ambulatory Visit: Payer: Self-pay | Admitting: Internal Medicine

## 2024-07-31 ENCOUNTER — Telehealth: Payer: Self-pay | Admitting: Emergency Medicine

## 2024-07-31 ENCOUNTER — Ambulatory Visit: Payer: Self-pay | Admitting: Pulmonary Disease

## 2024-07-31 NOTE — Telephone Encounter (Signed)
 Pt considered new to us - not seen in over 3 years  She has ov with [facility] to re est 08/22/24  I called and advised her to keep appt and call PCP sooner if needed  Pt verbalized understanding Nothing further needed

## 2024-07-31 NOTE — Telephone Encounter (Signed)
 Copied from CRM #[id number]. Topic: Clinical - Medication Refill >> Jul 31, 2024 11:29 AM Ismael A wrote: Medication: Tiotropium Bromide -Olodaterol (STIOLTO RESPIMAT ) 2.5-2.5 MCG/ACT AERS  Has the patient contacted their pharmacy? No (Agent: If no, request that the patient contact the pharmacy for the refill. If patient does not wish to contact the pharmacy document the reason why and proceed with request.) (Agent: If yes, when and what did the pharmacy advise?)  This is the patient's preferred pharmacy:  [id number] [place], [place] - [address] [address] Phone: [phone] Fax: [phone]  Is this the correct pharmacy for this prescription? Yes If no, delete pharmacy and type the correct one.   Has the prescription been filled recently? No  Is the patient out of the medication? Yes  Has the patient been seen for an appointment in the last year OR does the patient have an upcoming appointment? Yes  Can we respond through MyChart? No  Agent: Please be advised that Rx refills may take up to 3 business days. We ask that you follow-up with your pharmacy.

## 2024-07-31 NOTE — Telephone Encounter (Signed)
 Patient transferred over to scheduler to be scheduled due to the system requiring New Patient visit    FYI Only or Action Required?: FYI only for provider.  Patient is followed in Pulmonology for copd, last seen on 12/22/2020 by Almarie Ferrari NP .  Called Nurse Triage reporting Shortness of Breath.  Symptoms began about a month ago.  Interventions attempted: sudafed, mucinex.  Symptoms are: gradually worsening.  Triage Disposition: See HCP Within 4 Hours (Or PCP Triage)  Patient/caregiver understands and will follow disposition?: Yes---patient transferred to scheduling            Copied from CRM #[id number]. Topic: Clinical - Red Word Triage >> Jul 31, 2024 10:59 AM Devaughn RAMAN wrote: Red Word that prompted transfer to Nurse Triage: trouble breathing Reason for Disposition . [1] Longstanding difficulty breathing (e.g., CHF, COPD, emphysema) AND [2] WORSE than normal  Answer Assessment - Initial Assessment Questions Patient has not been seen in the office since 2022 and tried to get a refill of her Stiolto but has to come in for an appt She states some mild shortness of breath a little worse than normal over the past month Patient is advised to call us  back if anything changes or with any further questions/concerns. Patient is advised that if anything worsens to go to the Emergency Room. Patient verbalized understanding.    [facility] Triage - Initial Assessment Questions Chief Complaint (e.g., cough, sob, wheezing, fever, chills, sweat or additional symptoms) *Go to specific symptom protocol after initial questions. Shortness of breath a little worse than normal  How long have symptoms been present? X about a month   MEDICINES:   Have you used any OTC meds to help with symptoms? Yes If yes, ask What medications? Sudafed mucinex  Have you used your inhalers/maintenance medication? Yes If yes, What medications? Albuterol  Inhaler-- Dph:PWYJOZ 2  PUFFS INTO THE LUNGS EVERY 6 HOURS AS NEEDED FOR WHEEZING  Stiolto--INHALE 2 PUFFS BY MOUTH INTO THE LUNGS DAILY --patient out of this medication  If inhaler, ask How many puffs and how often? Note: Review instructions on medication in the chart. Albuterol  Inhaler-- Dph:PWYJOZ 2 PUFFS INTO THE LUNGS EVERY 6 HOURS AS NEEDED FOR WHEEZING  Stiolto--INHALE 2 PUFFS BY MOUTH INTO THE LUNGS DAILY ---patient out of this medication for a week   OXYGEN:   3. PATTERN Does the difficult breathing come and go, or has it been constant since it started?      ---- 4. SEVERITY: How bad is your breathing? (e.g., mild, moderate, severe)      mild 5. RECURRENT SYMPTOM: Have you had difficulty breathing before? If Yes, ask: When was the last time? and What happened that time?      ---- 6. CARDIAC HISTORY: Do you have any history of heart disease? (e.g., heart attack, angina, bypass surgery, angioplasty)      ---- 7. LUNG HISTORY: Do you have any history of lung disease?  (e.g., pulmonary embolus, asthma, emphysema)     copd 8. CAUSE: What do you think is causing the breathing problem?      Maybe sinus issues 9. OTHER SYMPTOMS: Do you have any other symptoms? (e.g., chest pain, cough, dizziness, fever, runny nose)     Cough with clear phlegm  Protocols used: Breathing Difficulty-A-AH

## 2024-07-31 NOTE — Telephone Encounter (Signed)
 FYI Only or Action Required?: Action required by provider: update on patient condition.  Called Nurse Triage reporting Shortness of Breath.  Symptoms began several weeks ago.  Triage Disposition: Call EMS 911 Now  Patient/caregiver understands and will follow disposition?: No            Copied from CRM #[id number]. Topic: Clinical - Red Word Triage >> Jul 31, 2024 10:52 AM Nathanel DEL wrote: Red Word that prompted transfer to Nurse Triage: pt is SOB. Has not been seen in a while and out of inhaler.  Ses Dr [facility] Tiotropium Bromide -Olodaterol (STIOLTO RESPIMAT ) 2.5-2.5 MCG/ACT AERS Reason for Disposition  [1] Chest pain lasts > 5 minutes AND [2] age > 30 AND [3] one or more cardiac risk factors (e.g., diabetes, high blood pressure, high cholesterol, obesity with BMI 30 or higher, smoker, or strong family history of heart disease)  Answer Assessment - Initial Assessment Questions This RN recommends pt goes to ED but pt refused. This RN notified [facility] of pt refusal of ED disposition even though pt has not been seen since 2022. This RN will still send a HP message to clinic due to symptoms.   Hard to get a deep breath in for 2 weeks   RESPIRATORY STATUS: Describe your breathing? (e.g., wheezing, shortness of breath, unable to speak, severe coughing)     Coughing, wheezing  PATTERN Does the difficult breathing come and go, or has it been constant since it started?      Comes and goes  SEVERITY: How bad is your breathing? (e.g., mild, moderate, severe)      Moderate  RECURRENT SYMPTOM: Have you had difficulty breathing before? If Yes, ask: When was the last time? and What happened that time?      Yes, pt is out of her Stiolto inhaler  OTHER SYMPTOMS: Do you have any other symptoms? (e.g., chest pain, cough, dizziness, fever, runny nose)     Intermittent chest pain (right in middle under windpipe, lasts 10 mins or less, 5/10 pain level)  O2 SATURATION MONITOR:  Do you  use an oxygen saturation monitor (pulse oximeter) at home? If Yes, ask: What is your reading (oxygen level) today? What is your usual oxygen saturation reading? (e.g., 95%)       Yes, 86%; not sure what it normally runs  Protocols used: Breathing Difficulty-A-AH, Chest Pain-A-AH

## 2024-08-22 ENCOUNTER — Encounter: Payer: Self-pay | Admitting: Pulmonary Disease

## 2024-08-22 ENCOUNTER — Ambulatory Visit (INDEPENDENT_AMBULATORY_CARE_PROVIDER_SITE_OTHER): Admitting: Pulmonary Disease

## 2024-08-22 VITALS — BP 134/86 | HR 83 | Temp 97.8°F | Ht 64.0 in | Wt 142.0 lb

## 2024-08-22 DIAGNOSIS — R911 Solitary pulmonary nodule: Secondary | ICD-10-CM | POA: Diagnosis not present

## 2024-08-22 DIAGNOSIS — J449 Chronic obstructive pulmonary disease, unspecified: Secondary | ICD-10-CM

## 2024-08-22 MED ORDER — ALBUTEROL SULFATE HFA 108 (90 BASE) MCG/ACT IN AERS: INHALATION_SPRAY | RESPIRATORY_TRACT | 3 refills | Status: DC

## 2024-08-22 MED ORDER — STIOLTO RESPIMAT 2.5-2.5 MCG/ACT IN AERS: INHALATION_SPRAY | RESPIRATORY_TRACT | 9 refills | Status: AC

## 2024-08-22 NOTE — Patient Instructions (Signed)
  VISIT SUMMARY: During your visit, we discussed your COPD management and the recent increase in your albuterol  use due to running out of Stiolto. We also reviewed the follow-up plan for your lung nodule.  YOUR PLAN: CHRONIC OBSTRUCTIVE PULMONARY DISEASE (COPD): You have moderate COPD, which has been well-managed with Stiolto. Recently, you have been using your albuterol  inhaler more frequently due to running out of Stiolto. -Continue using Stiolto for maintenance therapy. -Ensure you have your albuterol  inhaler available for rescue use. -I have sent new prescriptions to your pharmacy.  STABLE LUNG NODULES: You have stable lung nodules, with a new nodule identified this year. -Continue with the scheduled follow-up CT scan in January.

## 2024-08-22 NOTE — Progress Notes (Signed)
 Susan Berger    [id number]    1961/01/13  Primary Care Physician:Tisovec, Charlie ORN, MD  Referring Physician: Tisovec, Richard W, MD [address] [place],  [place] [address]  Chief complaint: Follow-up for COPD  HPI: 63 y.o. who  has a past medical history of Allergy, Anxiety, Anxiety, Arthritis, Borderline diabetes, Constipation, COPD (chronic obstructive pulmonary disease) (HCC), Depression, Diabetes mellitus, History of kidney stones, Hyperlipidemia, Migraine, and Multiple allergies.  Discussed the use of AI scribe software for clinical note transcription with the patient, who gave verbal consent to proceed.  History of Present Illness Susan Berger is a 63 year old female with moderate COPD who presents for follow-up.  She has not been seen in our office since 2022  Chronic obstructive pulmonary disease (copd) symptoms and management - Moderate COPD with chronic symptoms. - Stiolto inhaler has been used effectively for many years. - Has been out of Stiolto for almost two weeks, resulting in increased use of albuterol  rescue inhaler three to four times daily. - When on Stiolto, uses albuterol  rescue inhaler twice daily out of habit. - No current tobacco use; quit smoking in 2012.  Pulmonary nodule - New lung nodule identified earlier this year. 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 Exposures: No mold, hot tub, Jacuzzi.  No feather pillow comforter No h/o chemo/XRT/amiodarone/macrodantin/MTX  No exposure to asbestos, silica or other organic allergens  Smoking history: 33-pack-year smoker.  Quit in 2012 Travel history: No significant travel history Family history: Mother had COPD and lung cancer.  She was a smoker.   Outpatient Encounter Medications as of  08/22/2024  Medication Sig   atorvastatin  (LIPITOR ) 80 MG tablet Take 80 mg by mouth every evening.    cyclobenzaprine  (FLEXERIL ) 5 MG tablet Take 5-10 mg by mouth at bedtime as needed for muscle spasms.   CYMBALTA  60 MG capsule Take 60 mg by mouth every evening.    loratadine  (CLARITIN ) 10 MG tablet Take 10 mg by mouth daily as needed for allergies.    magnesium  oxide (MAG-OX) 400 MG tablet Take 400 mg by mouth every evening.   metFORMIN  (GLUCOPHAGE ) 500 MG tablet Take 500 mg by mouth in the morning and at bedtime.    [DISCONTINUED] albuterol  (VENTOLIN  HFA) 108 (90 Base) MCG/ACT inhaler INHALE 2 PUFFS INTO THE LUNGS EVERY 6 HOURS AS NEEDED FOR WHEEZING   albuterol  (VENTOLIN  HFA) 108 (90 Base) MCG/ACT inhaler INHALE 2 PUFFS INTO THE LUNGS EVERY 6 HOURS AS NEEDED FOR WHEEZING.   HYDROcodone -acetaminophen  (NORCO) 7.5-325 MG tablet Take 1 tablet by mouth every 4 (four) hours as needed for moderate pain ((score 4 to 6)). (Patient not taking: Reported on 08/22/2024)   methocarbamol  (ROBAXIN ) 500 MG tablet Take 1 tablet (500 mg total) by mouth every 6 (six) hours as needed for muscle spasms. (Patient not taking: Reported on 08/22/2024)   methocarbamol  (ROBAXIN ) 500 MG tablet Take 1 tablet (500 mg total) by mouth every 6 (six) hours as needed for muscle spasms. (Patient not taking: Reported on 08/22/2024)   NEURONTIN  300 MG capsule Take 300 mg by mouth 3 (three) times daily. (Patient not taking: Reported on 08/22/2024)   Tiotropium Bromide -Olodaterol (STIOLTO RESPIMAT ) 2.5-2.5 MCG/ACT AERS INHALE 2 PUFFS BY MOUTH INTO THE LUNGS DAILY   traMADol (ULTRAM) 50 MG tablet Take 50  mg by mouth 3 (three) times daily as needed for severe pain. (Patient not taking: Reported on 08/22/2024)   [DISCONTINUED] Tiotropium Bromide -Olodaterol (STIOLTO RESPIMAT ) 2.5-2.5 MCG/ACT AERS INHALE 2 PUFFS BY MOUTH INTO THE LUNGS DAILY   Facility-Administered Encounter Medications as of 08/22/2024  Medication   triamcinolone  acetonide  (KENALOG ) 10 MG/ML injection 10 mg     Physical Exam: Today's Vitals   08/22/24 0821  BP: 134/86  Pulse: 83  Temp: 97.8 F (36.6 C)  TempSrc: Oral  SpO2: 99%  Weight: 142 lb (64.4 kg)  Height: 5' 4 (1.626 m)   Body mass index is 24.37 kg/m.  Physical Exam GEN: No acute distress. CV: Regular rate and rhythm, no murmurs. LUNGS: Clear to auscultation bilaterally, normal respiratory effort. SKIN JOINTS: Warm and dry, no rash.  Data Reviewed: Imaging: Low-dose screening CT 04/22/2024-moderate emphysema, minimal scarring in the lingula.  New clustered nodularity in the right costophrenic angle. I had reviewed the images personally.  PFTs: 09/05/2016 FVC 3.00 [98%], FEV1 1.93 [64%], F/F64, TLC 6.02 [109%], DLCO 17.84 [63%] Moderate obstruction, moderate diffusion defect  Labs:  Assessment & Plan Chronic obstructive pulmonary disease (COPD) Moderate COPD with well-managed symptoms on Stiolto. Increased use of albuterol  inhaler due to recent lapse in Stiolto supply. Smoking cessation in 2012. 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 No recent exposure to mold or other environmental triggers. Family history of COPD and lung cancer. - Continue Stiolto for maintenance therapy. - Ensure albuterol  inhaler is available for rescue use. - Sent new prescriptions to pharmacy.  Stable lung nodules Lung nodules are stable with annual CT scans. New clustered nodules identified this year with a follow-up CT scheduled for January. - Continue with scheduled follow-up CT in January.  Recommendations: Renew Stiolto.  Continue albuterol  Follow-up CT scan in January 2026  Lonna Coder MD [facility] 08/22/2024, 8:42 AM  CC: Tisovec, Charlie ORN, MD

## 2024-10-28 ENCOUNTER — Inpatient Hospital Stay
Admission: RE | Admit: 2024-10-28 | Discharge: 2024-10-28 | Disposition: A | Source: Ambulatory Visit | Attending: Acute Care | Admitting: Acute Care

## 2024-10-28 DIAGNOSIS — R911 Solitary pulmonary nodule: Secondary | ICD-10-CM

## 2024-11-06 ENCOUNTER — Other Ambulatory Visit: Payer: Self-pay

## 2024-11-06 DIAGNOSIS — Z122 Encounter for screening for malignant neoplasm of respiratory organs: Secondary | ICD-10-CM

## 2024-11-06 DIAGNOSIS — Z87891 Personal history of nicotine dependence: Secondary | ICD-10-CM

## 2024-11-18 ENCOUNTER — Telehealth (HOSPITAL_BASED_OUTPATIENT_CLINIC_OR_DEPARTMENT_OTHER): Payer: Self-pay

## 2024-11-18 ENCOUNTER — Other Ambulatory Visit (HOSPITAL_BASED_OUTPATIENT_CLINIC_OR_DEPARTMENT_OTHER): Payer: Self-pay

## 2024-11-18 MED ORDER — ALBUTEROL SULFATE HFA 108 (90 BASE) MCG/ACT IN AERS: INHALATION_SPRAY | RESPIRATORY_TRACT | 3 refills | Status: AC
